# Patient Record
Sex: Female | Born: 1970 | Race: White | Hispanic: No | Smoking: Former smoker
Health system: Southern US, Community
[De-identification: ages and names within clinical notes are randomized; demographics above are authoritative.]

## PROBLEM LIST (undated history)

## (undated) ENCOUNTER — Inpatient Hospital Stay (HOSPITAL_COMMUNITY): Payer: Self-pay

## (undated) DIAGNOSIS — D649 Anemia, unspecified: Secondary | ICD-10-CM

## (undated) DIAGNOSIS — F419 Anxiety disorder, unspecified: Secondary | ICD-10-CM

## (undated) DIAGNOSIS — T7840XA Allergy, unspecified, initial encounter: Secondary | ICD-10-CM

## (undated) DIAGNOSIS — O26899 Other specified pregnancy related conditions, unspecified trimester: Secondary | ICD-10-CM

## (undated) DIAGNOSIS — O09519 Supervision of elderly primigravida, unspecified trimester: Secondary | ICD-10-CM

## (undated) DIAGNOSIS — J45909 Unspecified asthma, uncomplicated: Secondary | ICD-10-CM

## (undated) DIAGNOSIS — R1031 Right lower quadrant pain: Secondary | ICD-10-CM

## (undated) DIAGNOSIS — M1990 Unspecified osteoarthritis, unspecified site: Secondary | ICD-10-CM

## (undated) HISTORY — DX: Anemia, unspecified: D64.9

## (undated) HISTORY — DX: Unspecified asthma, uncomplicated: J45.909

## (undated) HISTORY — DX: Anxiety disorder, unspecified: F41.9

## (undated) HISTORY — DX: Unspecified osteoarthritis, unspecified site: M19.90

## (undated) HISTORY — DX: Supervision of elderly primigravida, unspecified trimester: O09.519

## (undated) HISTORY — PX: OTHER SURGICAL HISTORY: SHX169

## (undated) HISTORY — DX: Allergy, unspecified, initial encounter: T78.40XA

---

## 2011-09-06 ENCOUNTER — Ambulatory Visit: Payer: Self-pay | Admitting: Family Medicine

## 2011-12-29 ENCOUNTER — Ambulatory Visit: Payer: Self-pay | Admitting: Family Medicine

## 2012-01-03 ENCOUNTER — Ambulatory Visit: Payer: Self-pay | Admitting: Family Medicine

## 2012-01-05 ENCOUNTER — Ambulatory Visit: Payer: Self-pay | Admitting: Family Medicine

## 2012-01-23 ENCOUNTER — Ambulatory Visit: Payer: Self-pay | Admitting: Physician Assistant

## 2012-01-25 ENCOUNTER — Ambulatory Visit: Payer: Self-pay | Admitting: Physician Assistant

## 2012-10-09 ENCOUNTER — Ambulatory Visit: Payer: BC Managed Care – PPO

## 2012-10-09 ENCOUNTER — Encounter: Payer: Self-pay | Admitting: Family Medicine

## 2012-10-09 ENCOUNTER — Ambulatory Visit (INDEPENDENT_AMBULATORY_CARE_PROVIDER_SITE_OTHER): Payer: BC Managed Care – PPO | Admitting: Family Medicine

## 2012-10-09 VITALS — BP 119/64 | HR 72 | Temp 98.0°F | Resp 16 | Ht 68.0 in | Wt 183.0 lb

## 2012-10-09 DIAGNOSIS — M549 Dorsalgia, unspecified: Secondary | ICD-10-CM

## 2012-10-09 DIAGNOSIS — G8929 Other chronic pain: Secondary | ICD-10-CM

## 2012-10-09 DIAGNOSIS — M542 Cervicalgia: Secondary | ICD-10-CM | POA: Insufficient documentation

## 2012-10-09 DIAGNOSIS — E663 Overweight: Secondary | ICD-10-CM | POA: Insufficient documentation

## 2012-10-09 MED ORDER — CYCLOBENZAPRINE HCL 10 MG PO TABS: ORAL_TABLET | ORAL | Status: DC

## 2012-10-09 NOTE — Patient Instructions (Addendum)
Degenerative Disk Disease Degenerative disk disease is a condition caused by the changes that occur in the cushions of the backbone (spinal disks) as you grow older. Spinal disks are soft and compressible disks located between the bones of the spine (vertebrae). They act like shock absorbers. Degenerative disk disease can affect the whole spine. However, the neck and lower back are most commonly affected. Many changes can occur in the spinal disks with aging, such as:  The spinal disks may dry and shrink.  Small tears may occur in the tough, outer covering of the disk (annulus).  The disk space may become smaller due to loss of water.  Abnormal growths in the bone (spurs) may occur. This can put pressure on the nerve roots exiting the spinal canal, causing pain.  The spinal canal may become narrowed. CAUSES  Degenerative disk disease is a condition caused by the changes that occur in the spinal disks with aging. The exact cause is not known, but there is a genetic basis for many patients. Degenerative changes can occur due to loss of fluid in the disk. This makes the disk thinner and reduces the space between the backbones. Small cracks can develop in the outer layer of the disk. This can lead to the breakdown of the disk. You are more likely to get degenerative disk disease if you are overweight. Smoking cigarettes and doing heavy work such as weightlifting can also increase your risk of this condition. Degenerative changes can start after a sudden injury. Growth of bone spurs can compress the nerve roots and cause pain.  SYMPTOMS  The symptoms vary from person to person. Some people may have no pain, while others have severe pain. The pain may be so severe that it can limit your activities. The location of the pain depends on the part of your backbone that is affected. You will have neck or arm pain if a disk in the neck area is affected. You will have pain in your back, buttocks, or legs if a disk  in the lower back is affected. The pain becomes worse while bending, reaching up, or with twisting movements. The pain may start gradually and then get worse as time passes. It may also start after a major or minor injury. You may feel numbness or tingling in the arms or legs.  DIAGNOSIS  Your caregiver will ask you about your symptoms and about activities or habits that may cause the pain. He or she may also ask about any injuries, diseases, or treatments you have had earlier. Your caregiver will examine you to check for the range of movement that is possible in the affected area, to check for strength in your extremities, and to check for sensation in the areas of the arms and legs supplied by different nerve roots. An X-ray of the spine may be taken. Your caregiver may suggest other imaging tests, such as magnetic resonance imaging (MRI), if needed.  TREATMENT  Treatment includes rest, modifying your activities, and applying ice and heat. Your caregiver may prescribe medicines to reduce your pain and may ask you to do some exercises to strengthen your back. In some cases, you may need surgery. You and your caregiver will decide on the treatment that is best for you. HOME CARE INSTRUCTIONS   Follow proper lifting and walking techniques as advised by your caregiver.  Maintain good posture.  Exercise regularly as advised.  Perform relaxation exercises.  Change your sitting, standing, and sleeping habits as advised. Change positions   frequently.  Lose weight as advised.  Stop smoking if you smoke.  Wear supportive footwear. SEEK MEDICAL CARE IF:  Your pain does not go away within 1 to 4 weeks. SEEK IMMEDIATE MEDICAL CARE IF:   Your pain is severe.  You notice weakness in your arms, hands, or legs.  You begin to lose control of your bladder or bowel movements. MAKE SURE YOU:   Understand these instructions.  Will watch your condition.  Will get help right away if you are not doing  well or get worse. Document Released: 07/16/2007 Document Revised: 12/11/2011 Document Reviewed: 07/16/2007 [facility] Patient Information 2013 [place], [place].   For pain- take Aleve 1-2 tablets twice daily with food. Use heat therapy patches or moist heat for pain relief. Continue with Yoga. Limit lifting to no more than 20-25 pounds.

## 2012-10-09 NOTE — Progress Notes (Signed)
S: This 42 y.o. 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 She was an athlete in as a youth. She tried a yoga class recently and felt better for about 12 hours. Pt reports difficulty w/ sleep as well as AM stiffness and crepitus across shoulders and in neck. She denies weakness or numbness in upper extremities.  ROS: As per HPI.  O: Filed Vitals:   10/09/12 0907  BP: 119/64  Pulse: 72  Temp: 98 F (36.7 C)  Resp: 16   GEN: In NAD; WN,WD. HENT: Ad[place]/AT; EOMI w/ clear conj/sclerae. EACs normal. Oroph clear and moist. NECK: Good ROM w/ tender posterior muscles. No LAN or TMG. Head held in forward flexion. COR: RRR. LUNGS: Normal resp rate and effort. BACK: Mild kyphosis of T-spine and paravertebral muscle spasms. NEURO: A&O x 3; CNs intact. Motor- UEs 4/5 w/ good grip. DTRs- 1+/= in UEs. Nonfocal.   [facility] reading (PRIMARY) by  Dr. Audria Nine: C- spine- no significant abnormality.                                                                             T- spine- Mild scoliosis vs. muscle spams and early degenerative changes in mid-spine.  A/P:  1. Chronic back pain   Ambulatory referral to Physical Therapy [facility]  2-3 times/ week   2. Neck pain  Continue OTC NSAIDs, topical analgesic (try adhesive patch); work restrictions to include no weight lifting above 20-25 lbs.

## 2013-11-07 DIAGNOSIS — Z0271 Encounter for disability determination: Secondary | ICD-10-CM

## 2014-07-15 LAB — OB RESULTS CONSOLE RUBELLA ANTIBODY, IGM
Rubella: IMMUNE
Rubella: UNDETERMINED

## 2014-07-15 LAB — OB RESULTS CONSOLE GC/CHLAMYDIA
Chlamydia: NEGATIVE
Gonorrhea: NEGATIVE

## 2014-07-15 LAB — OB RESULTS CONSOLE ABO/RH: RH Type: POSITIVE

## 2014-07-15 LAB — OB RESULTS CONSOLE HEPATITIS B SURFACE ANTIGEN: Hepatitis B Surface Ag: NEGATIVE

## 2014-07-15 LAB — OB RESULTS CONSOLE PLATELET COUNT: Platelets: 243 10*3/uL

## 2014-07-15 LAB — OB RESULTS CONSOLE HIV ANTIBODY (ROUTINE TESTING): HIV: NONREACTIVE

## 2014-07-15 LAB — OB RESULTS CONSOLE HGB/HCT, BLOOD
HCT: 34 %
HEMOGLOBIN: 12 g/dL

## 2014-07-15 LAB — OB RESULTS CONSOLE ANTIBODY SCREEN: ANTIBODY SCREEN: NEGATIVE

## 2014-10-02 NOTE — L&D Delivery Note (Signed)
Operative Delivery Note At 6:42 AM a viable and healthy female was delivered via Vaginal, Vacuum Investment banker, operational(Extractor).  Presentation: vertex; Position: Left,, Occiput,, Anterior; Station: +2.  Verbal consent: obtained from patient.  Risks and benefits discussed in detail.  Risks include, but are not limited to the risks of anesthesia, bleeding, infection, damage to maternal tissues, fetal cephalhematoma.  There is also the risk of inability to effect vaginal delivery of the head, or shoulder dystocia that cannot be resolved by established maneuvers, leading to the need for emergency cesarean section. Indication: fetal tachycardia with intermittent deep variable decels APGAR: 4, 9; weight pending  .   Placenta status: Intact, Pathology Spontaneous.   Cord: 2 vessels with the following complications: None.  Cord pH: 7.17  Anesthesia: Epidural  Instruments: mushroom vacuum Episiotomy: None Lacerations: Periurethral;2nd degree Perineal Suture Repair: 3.0 chromic Est. Blood Loss (mL): 350  Mom to postpartum.  Baby to Couplet care / Skin to Skin.  Tammy Horne A 01/27/2015, 7:47 AM

## 2014-11-03 LAB — OB RESULTS CONSOLE RPR: RPR: NONREACTIVE

## 2014-12-07 ENCOUNTER — Encounter (HOSPITAL_COMMUNITY): Payer: Self-pay | Admitting: *Deleted

## 2014-12-07 ENCOUNTER — Inpatient Hospital Stay (HOSPITAL_COMMUNITY)
Admission: AD | Admit: 2014-12-07 | Discharge: 2014-12-07 | Disposition: A | Discharge status: Home / Self Care | Payer: 59 | Source: Ambulatory Visit | Attending: Obstetrics and Gynecology | Admitting: Obstetrics and Gynecology

## 2014-12-07 DIAGNOSIS — O212 Late vomiting of pregnancy: Secondary | ICD-10-CM | POA: Insufficient documentation

## 2014-12-07 DIAGNOSIS — Z3A32 32 weeks gestation of pregnancy: Secondary | ICD-10-CM | POA: Insufficient documentation

## 2014-12-07 LAB — URIC ACID: URIC ACID, SERUM: 3.5 mg/dL (ref 2.4–7.0)

## 2014-12-07 LAB — COMPREHENSIVE METABOLIC PANEL
ALBUMIN: 3.2 g/dL — AB (ref 3.5–5.2)
ALK PHOS: 91 U/L (ref 39–117)
ALT: 27 U/L (ref 0–35)
AST: 20 U/L (ref 0–37)
Anion gap: 7 (ref 5–15)
BUN: 12 mg/dL (ref 6–23)
CO2: 20 mmol/L (ref 19–32)
CREATININE: 0.49 mg/dL — AB (ref 0.50–1.10)
Calcium: 9.2 mg/dL (ref 8.4–10.5)
Chloride: 110 mmol/L (ref 96–112)
GFR calc Af Amer: >90 mL/min (ref >90)
GFR calc non Af Amer: >90 mL/min (ref >90)
Glucose, Bld: 91 mg/dL (ref 70–99)
POTASSIUM: 4.4 mmol/L (ref 3.5–5.1)
Sodium: 137 mmol/L (ref 135–145)
Total Bilirubin: 0.4 mg/dL (ref 0.3–1.2)
Total Protein: 6.6 g/dL (ref 6.0–8.3)

## 2014-12-07 LAB — CBC
HEMATOCRIT: 38.2 % (ref 36.0–46.0)
Hemoglobin: 13.2 g/dL (ref 12.0–15.0)
MCH: 31.9 pg (ref 26.0–34.0)
MCHC: 34.6 g/dL (ref 30.0–36.0)
MCV: 92.3 fL (ref 78.0–100.0)
PLATELETS: 242 10*3/uL (ref 150–400)
RBC: 4.14 MIL/uL (ref 3.87–5.11)
RDW: 13.6 % (ref 11.5–15.5)
WBC: 11.7 10*3/uL — AB (ref 4.0–10.5)

## 2014-12-07 MED ORDER — LACTATED RINGERS IV BOLUS (SEPSIS)
1000.0000 mL | Freq: Once | INTRAVENOUS | Status: AC
  Administered 2014-12-07: 1000 mL via INTRAVENOUS

## 2014-12-07 MED ORDER — ONDANSETRON 8 MG/NS 50 ML IVPB
8.0000 mg | Freq: Once | INTRAVENOUS | Status: AC
  Administered 2014-12-07: 8 mg via INTRAVENOUS
  Filled 2014-12-07: qty 8

## 2014-12-07 MED ORDER — M.V.I. ADULT IV INJ
Freq: Once | INTRAVENOUS | Status: AC
  Administered 2014-12-07: 17:00:00 via INTRAVENOUS
  Filled 2014-12-07: qty 10

## 2014-12-07 NOTE — MAU Provider Note (Signed)
History     CSN: [id number]  Arrival date and time: 12/07/14 1434 Provider notified of pt arrival  Provider at bedside   Chief Complaint  Patient presents with  . Nausea   HPI Comments: G3P0020 .3 wks sent from office for N/V. Reports not feeling well over the weekend, had a lot of stress from work and several panic attacks. One episode of N/V this am after eating breakfast. Second episode while traveling to work. Reports feeling like she was near passing out while driving, therefore she pulled over, vomited, and lied down until she felt better. She had one more episode of N/V after returning home. She tolerated lunch w/o problem and no N/V since. Good FM. No VB, cramping, or ctx. No fever. No diarrhea. No one in household sick. Had takeout dinner last night. Pregnancy complicated by AMA>40, 2VC, and anxiety.   OB History    Gravida Para Term Preterm AB TAB SAB Ectopic Multiple Living   0      Past Medical History  Diagnosis Date  . Allergy   . Anemia   . Anxiety   . Arthritis   . Asthma     History reviewed. No pertinent past surgical history.  History reviewed. No pertinent family history.  History  Substance Use Topics  . Smoking status: Never Smoker   . Smokeless tobacco: Not on file  . Alcohol Use: No    Allergies: No Known Allergies  Prescriptions prior to admission  Medication Sig Dispense Refill Last Dose  . ALPRAZolam (XANAX) 0.25 MG tablet Take 0.25 mg by mouth at bedtime as needed.   Taking  . cyclobenzaprine (FLEXERIL) 10 MG tablet Take 1 tablet at bedtime or bid prn back pain and spasms. 60 tablet 1   . FLUoxetine (PROZAC) 40 MG capsule Take 60 mg by mouth daily.   Taking    Review of Systems  Constitutional: Positive for malaise/fatigue.  HENT: Negative.   Eyes: Negative.   Respiratory: Negative.   Cardiovascular: Negative.   Gastrointestinal: Negative.   Genitourinary: Negative.   Musculoskeletal: Negative.   Skin:  Negative.   Neurological: Negative.   Endo/Heme/Allergies: Negative.   Psychiatric/Behavioral: Negative.    Physical Exam   Blood pressure 106/74, pulse 89, temperature 98 F (36.7 C), temperature source Oral, resp. rate 16, height  (1.676 m), weight 84.369 kg (186 lb).  Physical Exam  Constitutional: She is oriented to person, place, and time. She appears well-developed and well-nourished.  HENT:  Head: Normocephalic and atraumatic.  Neck: Normal range of motion. Neck supple.  Cardiovascular: Normal rate and regular rhythm.   Respiratory: Effort normal and breath sounds normal.  GI: Soft. Bowel sounds are normal.  gravid  Genitourinary:  deferred  Musculoskeletal: Normal range of motion.  Neurological: She is alert and oriented to person, place, and time.  Skin: Skin is warm and dry.  Psychiatric: She has a normal mood and affect.  EFM: 140's, + accels, no decels, 10 beat variability Toco: no contractions  CBC    Component Value Date/Time   WBC 11.7* 12/07/2014 1500   RBC 4.14 12/07/2014 1500   HGB 13.2 12/07/2014 1500   HGB 12.0 07/15/2014   HCT 38.2 12/07/2014 1500   HCT 34 07/15/2014   PLT 242 12/07/2014 1500   PLT 243 07/15/2014   MCV 92.3 12/07/2014 1500   MCH 31.9 12/07/2014 1500   MCHC 34.6 12/07/2014 1500   RDW 13.6 12/07/2014  1500   CMP     Component Value Date/Time   NA 137 12/07/2014 1500   K 4.4 12/07/2014 1500   CL 110 12/07/2014 1500   CO2 20 12/07/2014 1500   GLUCOSE 91 12/07/2014 1500   BUN 12 12/07/2014 1500   CREATININE 0.49* 12/07/2014 1500   CALCIUM 9.2 12/07/2014 1500   PROT 6.6 12/07/2014 1500   ALBUMIN 3.2* 12/07/2014 1500   AST 20 12/07/2014 1500   ALT 27 12/07/2014 1500   ALKPHOS 91 12/07/2014 1500   BILITOT 0.4 12/07/2014 1500   GFRNONAA >90 12/07/2014 1500   GFRAA >90 12/07/2014 1500      MAU Course  Procedures IVF bolus IV Zofran IV MTV CBC, CMP, uric acid NST   Assessment and Plan  32.[redacted] weeks  gestation Nausea and vomiting. IMproved with meds and IV hydration. Pt tolerating oral intake. Finish 2nd bag of IVF then d/c home No evidence PEC, nl bp, nl labs  BHAMBRI, MELANIE, N   Zeshan Sena A. 12/07/2014 4:26 PM   12/07/2014, 3:22 PM

## 2014-12-07 NOTE — MAU Note (Signed)
Cramping, uncomfortable past 3 days. Today has had N/V, seeing stars, felt like she was going to pass out. Kept cereal down at 1200. States blood sugar elevated in office today and BP was low. Sent to MAU for further assessment and treatment.

## 2014-12-07 NOTE — Progress Notes (Signed)
Patient tolerating ice water and ice chips. Med given for nausea.

## 2014-12-07 NOTE — Progress Notes (Signed)
Tolerating water and crackers. Nausea subsided.

## 2014-12-07 NOTE — Discharge Instructions (Signed)
Keep your scheduled appointment for prenatal care. Call the office or provider on call with further concerns. °

## 2014-12-31 LAB — OB RESULTS CONSOLE GBS: STREP GROUP B AG: NEGATIVE

## 2015-01-12 ENCOUNTER — Inpatient Hospital Stay (HOSPITAL_COMMUNITY)
Admission: EM | Admit: 2015-01-12 | Discharge: 2015-01-12 | Disposition: A | Discharge status: Home / Self Care | Payer: 59 | Source: Ambulatory Visit | Attending: Obstetrics & Gynecology | Admitting: Obstetrics & Gynecology

## 2015-01-12 ENCOUNTER — Encounter (HOSPITAL_COMMUNITY): Payer: Self-pay | Admitting: *Deleted

## 2015-01-12 DIAGNOSIS — Z87891 Personal history of nicotine dependence: Secondary | ICD-10-CM | POA: Diagnosis not present

## 2015-01-12 DIAGNOSIS — O26899 Other specified pregnancy related conditions, unspecified trimester: Secondary | ICD-10-CM

## 2015-01-12 DIAGNOSIS — Z3A37 37 weeks gestation of pregnancy: Secondary | ICD-10-CM | POA: Diagnosis not present

## 2015-01-12 DIAGNOSIS — O09523 Supervision of elderly multigravida, third trimester: Secondary | ICD-10-CM | POA: Diagnosis not present

## 2015-01-12 DIAGNOSIS — O9989 Other specified diseases and conditions complicating pregnancy, childbirth and the puerperium: Secondary | ICD-10-CM | POA: Diagnosis not present

## 2015-01-12 DIAGNOSIS — R1031 Right lower quadrant pain: Secondary | ICD-10-CM | POA: Insufficient documentation

## 2015-01-12 HISTORY — DX: Right lower quadrant pain: R10.31

## 2015-01-12 HISTORY — DX: Other specified pregnancy related conditions, unspecified trimester: O26.899

## 2015-01-12 MED ORDER — PROMETHAZINE HCL 12.5 MG PO TABS: 12.5000 mg | ORAL_TABLET | Freq: Four times a day (QID) | ORAL | Status: DC | PRN

## 2015-01-12 NOTE — MAU Provider Note (Signed)
History     CSN: [id number]  Arrival date and time: 01/12/15 1125 Provider on unit: 1145 (waiting for pt to arrive, however, pt was registered under wrong MD) Provider at bedside: 1212      Chief Complaint  Patient presents with  . Abdominal Pain  . Emesis   HPI  Ms. Tammy MarchSarah Horne is a 44 yo G3P0020 at 37.[redacted] wks gestation sent from the office with complaints of sharp, stabbing RLQ pain since 0800. She was seen in the office today for an OB visit and NST.  While on her way to her appointment, she states that the pain increased to the point where she could barely stand up.  She started vomiting during her NST.  She reports eating shrimp at a restaurant last night, but felt fine until 0800 this morning.  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   Past Medical History  Diagnosis Date  . Allergy   . Anemia   . Anxiety   . Arthritis   . Asthma   . Pregnancy with abdominal pain of right lower quadrant, antepartum 01/12/2015    Past Surgical History  Procedure Laterality Date  . Lipo suction      Family History  Problem Relation Age of Onset  . Diabetes Mother     History  Substance Use Topics  . Smoking status: Former Games developermoker  . Smokeless tobacco: Not on file  . Alcohol Use: No    Allergies: No Known Allergies  Prescriptions prior to admission  Medication Sig Dispense Refill Last Dose  . diphenhydramine-acetaminophen (TYLENOL PM) 25-500 MG TABS Take 2 tablets by mouth at bedtime as needed (sleep).    01/11/2015 at Unknown time  . cyclobenzaprine (FLEXERIL) 10 MG tablet Take 1 tablet at bedtime or bid prn back pain and spasms. (Patient not taking: Reported on 12/07/2014) 60 tablet 1 more than one month    Review of Systems  Constitutional: Negative.   HENT: Negative.   Eyes: Negative.   Respiratory: Negative.   Cardiovascular: Negative.   Gastrointestinal: Positive for vomiting and abdominal pain.       RLQ pain that started at 8 am,  continued to increase while having NST in office, vomiting started at office; all sx's have resolved now "feels 110% better"  Genitourinary: Negative.        (+) FM; no UC's; VE at office 1cm with some bloody show  Musculoskeletal: Negative.   Skin: Negative.   Neurological: Negative.   Endo/Heme/Allergies: Negative.   Psychiatric/Behavioral: Negative.    CEFM FHR: bpm / moderate variability / accels present / no decels TOCO: irregular UC's   Physical Exam   Blood pressure 118/72, pulse 66, temperature 97.8 F (36.6 C), resp. rate 20, height 5\' 7"  (1.702 m), weight 85.276 kg (188 lb).  Physical Exam  Constitutional: She is oriented to person, place, and time. She appears well-developed and well-nourished.  HENT:  Head: Normocephalic and atraumatic.  Eyes: Pupils are equal, round, and reactive to light.  Neck: Normal range of motion.  Cardiovascular: Normal rate, regular rhythm, normal heart sounds and intact distal pulses.   Respiratory: Effort normal and breath sounds normal.  GI: Soft. Bowel sounds are normal.  Genitourinary:  Gravid, S=D, Pelvic deferred - done in the office  Musculoskeletal: Normal range of motion.  Neurological: She is alert and oriented to person, place, and time. She has normal reflexes.  Skin: Skin is warm and dry.  Psychiatric: She has  a normal mood and affect. Her behavior is normal. Judgment and thought content normal.    MAU Course  Procedures CEFM Assessment and Plan  44 yo G3P0020 at 37.[redacted] wks gestation Abdominal Pain with unknown etiology 2VC Category 1 FHR tracing  Discharge home Labor precautions / B.R.A.T. Diet instructions reviewed Rx for Phenergan 12.5 mg po every 4-6 hours prn Call the office, if you have any further problems, questions or concerns Schedule ROB and NST for next week ASAP  *Dr. Seymour Bars notified of assessment and plan - agrees  Kenard Gower MSN, CNM 01/12/2015, 12:22 PM

## 2015-01-12 NOTE — Progress Notes (Signed)
Written and verbal d/c instructions given and understanding voiced. 

## 2015-01-12 NOTE — MAU Note (Signed)
When awoke this am and walked around alittle started having sharp, stabbing pain RLQ. Pain so bad pt vomited. Denies diarrhea. Had appt at office and sent over to MAU from there. . 1cm in office. Did see blood in urine when obtained urine sample in [place].

## 2015-01-12 NOTE — Discharge Instructions (Signed)
Abdominal Pain During Pregnancy Abdominal pain is common in pregnancy. Most of the time, it does not cause harm. There are many causes of abdominal pain. Some causes are more serious than others. Some of the causes of abdominal pain in pregnancy are easily diagnosed. Occasionally, the diagnosis takes time to understand. Other times, the cause is not determined. Abdominal pain can be a sign that something is very wrong with the pregnancy, or the pain may have nothing to do with the pregnancy at all. For this reason, always tell your health care provider if you have any abdominal discomfort. HOME CARE INSTRUCTIONS  Monitor your abdominal pain for any changes. The following actions may help to alleviate any discomfort you are experiencing:  Do not have sexual intercourse or put anything in your vagina until your symptoms go away completely.  Get plenty of rest until your pain improves.  Drink clear fluids if you feel nauseous. Avoid solid food as long as you are uncomfortable or nauseous.  Only take over-the-counter or prescription medicine as directed by your health care provider.  Keep all follow-up appointments with your health care provider. SEEK IMMEDIATE MEDICAL CARE IF:  You are bleeding, leaking fluid, or passing tissue from the vagina.  You have increasing pain or cramping.  You have persistent vomiting.  You have painful or bloody urination.  You have a fever.  You notice a decrease in your baby's movements.  You have extreme weakness or feel faint.  You have shortness of breath, with or without abdominal pain.  You develop a severe headache with abdominal pain.  You have abnormal vaginal discharge with abdominal pain.  You have persistent diarrhea.  You have abdominal pain that continues even after rest, or gets worse. MAKE SURE YOU:   Understand these instructions.  Will watch your condition.  Will get help right away if you are not doing well or get  worse. Document Released: 09/18/2005 Document Revised: 07/09/2013 Document Reviewed: 04/17/2013 [facility]ExitCare Patient Information 2015 [place], [place]. This information is not intended to replace advice given to you by your health care provider. Make sure you discuss any questions you have with your health care provider. Braxton Hicks Contractions Contractions of the uterus can occur throughout pregnancy. Contractions are not always a sign that you are in labor.  WHAT ARE BRAXTON HICKS CONTRACTIONS?  Contractions that occur before labor are called Braxton Hicks contractions, or false labor. Toward the end of pregnancy (32-34 weeks), these contractions can develop more often and may become more forceful. This is not true labor because these contractions do not result in opening (dilatation) and thinning of the cervix. They are sometimes difficult to tell apart from true labor because these contractions can be forceful and people have different pain tolerances. You should not feel embarrassed if you go to the hospital with false labor. Sometimes, the only way to tell if you are in true labor is for your health care provider to look for changes in the cervix. If there are no prenatal problems or other health problems associated with the pregnancy, it is completely safe to be sent home with false labor and await the onset of true labor. HOW CAN YOU TELL THE DIFFERENCE BETWEEN TRUE AND FALSE LABOR? False Labor  The contractions of false labor are usually shorter and not as hard as those of true labor.   The contractions are usually irregular.   The contractions are often felt in the front of the lower abdomen and in the groin.  The contractions may go away when you walk around or change positions while lying down.   The contractions get weaker and are shorter lasting as time goes on.   The contractions do not usually become progressively stronger, regular, and closer together as with true labor.   True Labor  Contractions in true labor last 30-70 seconds, become very regular, usually become more intense, and increase in frequency.   The contractions do not go away with walking.   The discomfort is usually felt in the top of the uterus and spreads to the lower abdomen and low back.   True labor can be determined by your health care provider with an exam. This will show that the cervix is dilating and getting thinner.  WHAT TO REMEMBER  Keep up with your usual exercises and follow other instructions given by your health care provider.   Take medicines as directed by your health care provider.   Keep your regular prenatal appointments.   Eat and drink lightly if you think you are going into labor.   If Braxton Hicks contractions are making you uncomfortable:   Change your position from lying down or resting to walking, or from walking to resting.   Sit and rest in a tub of warm water.   Drink 2-3 glasses of water. Dehydration may cause these contractions.   Do slow and deep breathing several times an hour.  WHEN SHOULD I SEEK IMMEDIATE MEDICAL CARE? Seek immediate medical care if:  Your contractions become stronger, more regular, and closer together.   You have fluid leaking or gushing from your vagina.   You have a fever.   You pass blood-tinged mucus.   You have vaginal bleeding.   You have continuous abdominal pain.   You have low back pain that you never had before.   You feel your baby's head pushing down and causing pelvic pressure.   Your baby is not moving as much as it used to.  Document Released: 09/18/2005 Document Revised: 09/23/2013 Document Reviewed: 06/30/2013 [facility] Patient Information 2015 [place], [place]. This information is not intended to replace advice given to you by your health care provider. Make sure you discuss any questions you have with your health care provider. Food Choices to Help Relieve Vomiting When  you have vomiting, the foods you eat and your eating habits are very important. Choosing the right foods and drinks can help relieve voimiting. Also, because diarrhea can last up to 7 days, you need to replace lost fluids and electrolytes (such as sodium, potassium, and chloride) in order to help prevent dehydration.  WHAT GENERAL GUIDELINES DO I NEED TO FOLLOW?  Slowly drink 1 cup (8 oz) of fluid for each episode of diarrhea. If you are getting enough fluid, your urine will be clear or pale yellow.  Eat starchy foods. Some good choices include white rice, white toast, pasta, low-fiber cereal, baked potatoes (without the skin), saltine crackers, and bagels.  Avoid large servings of any cooked vegetables.  Limit fruit to two servings per day. A serving is  cup or 1 small piece.  Choose foods with less than 2 g of fiber per serving.  Limit fats to less than 8 tsp (38 g) per day.  Avoid fried foods.  Eat foods that have probiotics in them. Probiotics can be found in certain dairy products.  Avoid foods and beverages that may increase the speed at which food moves through the stomach and intestines (gastrointestinal tract). Things to avoid include:  High-fiber foods, such as dried fruit, raw fruits and vegetables, nuts, seeds, and whole grain foods.  Spicy foods and high-fat foods.  Foods and beverages sweetened with high-fructose corn syrup, honey, or sugar alcohols such as xylitol, sorbitol, and mannitol. WHAT FOODS ARE RECOMMENDED? Grains White rice. White, [place], or pita breads (fresh or toasted), including plain rolls, buns, or bagels. White pasta. Saltine, soda, or graham crackers. Pretzels. Low-fiber cereal. Cooked cereals made with water (such as cornmeal, farina, or cream cereals). Plain muffins. Matzo. Melba toast. Zwieback.  Vegetables Potatoes (without the skin). Strained tomato and vegetable juices. Most well-cooked and canned vegetables without seeds. Tender  lettuce. Fruits Cooked or canned applesauce, apricots, cherries, fruit cocktail, grapefruit, peaches, pears, or plums. Fresh bananas, apples without skin, cherries, grapes, cantaloupe, grapefruit, peaches, oranges, or plums.  Meat and Other Protein Products Baked or boiled chicken. Eggs. Tofu. Fish. Seafood. Smooth peanut butter. Ground or well-cooked tender beef, ham, veal, lamb, pork, or poultry.  Dairy Plain yogurt, kefir, and unsweetened liquid yogurt. Lactose-free milk, buttermilk, or soy milk. Plain hard cheese. Beverages Sport drinks. Clear broths. Diluted fruit juices (except prune). Regular, caffeine-free sodas such as ginger ale. Water. Decaffeinated teas. Oral rehydration solutions. Sugar-free beverages not sweetened with sugar alcohols. Other Bouillon, broth, or soups made from recommended foods.  The items listed above may not be a complete list of recommended foods or beverages. Contact your dietitian for more options. WHAT FOODS ARE NOT RECOMMENDED? Grains Whole grain, whole wheat, bran, or rye breads, rolls, pastas, crackers, and cereals. Wild or brown rice. Cereals that contain more than 2 g of fiber per serving. Corn tortillas or taco shells. Cooked or dry oatmeal. Granola. Popcorn. Vegetables Raw vegetables. Cabbage, broccoli, Brussels sprouts, artichokes, baked beans, beet greens, corn, kale, legumes, peas, sweet potatoes, and yams. Potato skins. Cooked spinach and cabbage. Fruits Dried fruit, including raisins and dates. Raw fruits. Stewed or dried prunes. Fresh apples with skin, apricots, mangoes, pears, raspberries, and strawberries.  Meat and Other Protein Products Chunky peanut butter. Nuts and seeds. Beans and lentils. Tomasa Blase.  Dairy High-fat cheeses. Milk, chocolate milk, and beverages made with milk, such as milk shakes. Cream. Ice cream. Sweets and Desserts Sweet rolls, doughnuts, and sweet breads. Pancakes and waffles. Fats and Oils Butter. Cream sauces.  Margarine. Salad oils. Plain salad dressings. Olives. Avocados.  Beverages Caffeinated beverages (such as coffee, tea, soda, or energy drinks). Alcoholic beverages. Fruit juices with pulp. Prune juice. Soft drinks sweetened with high-fructose corn syrup or sugar alcohols. Other Coconut. Hot sauce. Chili powder. Mayonnaise. Gravy. Cream-based or milk-based soups.  The items listed above may not be a complete list of foods and beverages to avoid. Contact your dietitian for more information. WHAT SHOULD I DO IF I BECOME DEHYDRATED? Diarrhea can sometimes lead to dehydration. Signs of dehydration include dark urine and dry mouth and skin. If you think you are dehydrated, you should rehydrate with an oral rehydration solution. These solutions can be purchased at pharmacies, retail stores, or online.  Drink -1 cup (120-240 mL) of oral rehydration solution each time you have an episode of diarrhea. If drinking this amount makes your diarrhea worse, try drinking smaller amounts more often. For example, drink 1-3 tsp (5-15 mL) every 5-10 minutes.  A general rule for staying hydrated is to drink 1-2 L of fluid per day. Talk to your health care provider about the specific amount you should be drinking each day. Drink enough fluids to keep your urine clear or pale  yellow.  Document Released: 12/09/2003 Document Revised: 09/23/2013 Document Reviewed: 08/11/2013 [facility] Patient Information 2015 [place], [place]. This information is not intended to replace advice given to you by your health care provider. Make sure you discuss any questions you have with your health care provider.

## 2015-01-20 ENCOUNTER — Other Ambulatory Visit: Payer: Self-pay | Admitting: Obstetrics and Gynecology

## 2015-01-21 ENCOUNTER — Encounter (HOSPITAL_COMMUNITY): Payer: Self-pay | Admitting: *Deleted

## 2015-01-21 ENCOUNTER — Telehealth (HOSPITAL_COMMUNITY): Payer: Self-pay | Admitting: *Deleted

## 2015-01-21 NOTE — Telephone Encounter (Signed)
Preadmission screen  

## 2015-01-26 ENCOUNTER — Inpatient Hospital Stay (HOSPITAL_COMMUNITY)
Admission: AD | Admit: 2015-01-26 | Discharge: 2015-01-29 | DRG: 775 | Disposition: A | Discharge status: Home / Self Care | Payer: 59 | Source: Ambulatory Visit | Attending: Obstetrics and Gynecology | Admitting: Obstetrics and Gynecology

## 2015-01-26 ENCOUNTER — Inpatient Hospital Stay (HOSPITAL_COMMUNITY): Payer: 59 | Admitting: Anesthesiology

## 2015-01-26 ENCOUNTER — Inpatient Hospital Stay (HOSPITAL_COMMUNITY): Payer: 59

## 2015-01-26 DIAGNOSIS — O09523 Supervision of elderly multigravida, third trimester: Secondary | ICD-10-CM

## 2015-01-26 DIAGNOSIS — Z3A38 38 weeks gestation of pregnancy: Secondary | ICD-10-CM | POA: Diagnosis present

## 2015-01-26 DIAGNOSIS — O4100X0 Oligohydramnios, unspecified trimester, not applicable or unspecified: Secondary | ICD-10-CM | POA: Diagnosis present

## 2015-01-26 DIAGNOSIS — O9902 Anemia complicating childbirth: Secondary | ICD-10-CM | POA: Diagnosis present

## 2015-01-26 DIAGNOSIS — O288 Other abnormal findings on antenatal screening of mother: Secondary | ICD-10-CM

## 2015-01-26 DIAGNOSIS — Z3483 Encounter for supervision of other normal pregnancy, third trimester: Secondary | ICD-10-CM | POA: Diagnosis present

## 2015-01-26 DIAGNOSIS — O4103X0 Oligohydramnios, third trimester, not applicable or unspecified: Principal | ICD-10-CM | POA: Diagnosis present

## 2015-01-26 LAB — CBC
HEMATOCRIT: 38.7 % (ref 36.0–46.0)
HEMOGLOBIN: 13.7 g/dL (ref 12.0–15.0)
MCH: 32.7 pg (ref 26.0–34.0)
MCHC: 35.4 g/dL (ref 30.0–36.0)
MCV: 92.4 fL (ref 78.0–100.0)
Platelets: 205 10*3/uL (ref 150–400)
RBC: 4.19 MIL/uL (ref 3.87–5.11)
RDW: 14 % (ref 11.5–15.5)
WBC: 14.5 10*3/uL — ABNORMAL HIGH (ref 4.0–10.5)

## 2015-01-26 LAB — TYPE AND SCREEN
ABO/RH(D): A POS
ANTIBODY SCREEN: NEGATIVE

## 2015-01-26 MED ORDER — FLEET ENEMA 7-19 GM/118ML RE ENEM: 1.0000 | ENEMA | RECTAL | Status: DC | PRN

## 2015-01-26 MED ORDER — DIPHENHYDRAMINE HCL 50 MG/ML IJ SOLN: 12.5000 mg | INTRAMUSCULAR | Status: DC | PRN

## 2015-01-26 MED ORDER — OXYTOCIN 40 UNITS IN LACTATED RINGERS INFUSION - SIMPLE MED: 62.5000 mL/h | INTRAVENOUS | Status: DC

## 2015-01-26 MED ORDER — BUTORPHANOL TARTRATE 1 MG/ML IJ SOLN: 1.0000 mg | Freq: Once | INTRAMUSCULAR | Status: DC

## 2015-01-26 MED ORDER — FENTANYL 2.5 MCG/ML BUPIVACAINE 1/10 % EPIDURAL INFUSION (WH - ANES)
14.0000 mL/h | INTRAMUSCULAR | Status: DC | PRN
  Administered 2015-01-26: 14 mL/h via EPIDURAL
  Filled 2015-01-26: qty 125

## 2015-01-26 MED ORDER — LACTATED RINGERS IV SOLN: 500.0000 mL | INTRAVENOUS | Status: DC | PRN

## 2015-01-26 MED ORDER — LIDOCAINE HCL (PF) 1 % IJ SOLN
INTRAMUSCULAR | Status: DC | PRN
  Administered 2015-01-26: 10 mL

## 2015-01-26 MED ORDER — EPHEDRINE 5 MG/ML INJ
10.0000 mg | INTRAVENOUS | Status: AC | PRN
  Administered 2015-01-26 (×2): 10 mg via INTRAVENOUS

## 2015-01-26 MED ORDER — LACTATED RINGERS IV SOLN
INTRAVENOUS | Status: DC
  Administered 2015-01-26 (×3): via INTRAVENOUS

## 2015-01-26 MED ORDER — LIDOCAINE HCL (PF) 1 % IJ SOLN
30.0000 mL | INTRAMUSCULAR | Status: DC | PRN
  Filled 2015-01-26: qty 30

## 2015-01-26 MED ORDER — EPHEDRINE 5 MG/ML INJ
10.0000 mg | INTRAVENOUS | Status: DC | PRN
  Filled 2015-01-26: qty 4
  Filled 2015-01-26: qty 2

## 2015-01-26 MED ORDER — ACETAMINOPHEN 325 MG PO TABS: 650.0000 mg | ORAL_TABLET | ORAL | Status: DC | PRN

## 2015-01-26 MED ORDER — TERBUTALINE SULFATE 1 MG/ML IJ SOLN: 0.2500 mg | Freq: Once | INTRAMUSCULAR | Status: AC | PRN

## 2015-01-26 MED ORDER — PHENYLEPHRINE 40 MCG/ML (10ML) SYRINGE FOR IV PUSH (FOR BLOOD PRESSURE SUPPORT)
80.0000 ug | PREFILLED_SYRINGE | INTRAVENOUS | Status: DC | PRN
  Filled 2015-01-26: qty 20
  Filled 2015-01-26: qty 2

## 2015-01-26 MED ORDER — OXYTOCIN 40 UNITS IN LACTATED RINGERS INFUSION - SIMPLE MED
1.0000 m[IU]/min | INTRAVENOUS | Status: DC
Start: 2015-01-26 — End: 2015-01-27
  Administered 2015-01-27: 2 m[IU]/min via INTRAVENOUS
  Filled 2015-01-26: qty 1000

## 2015-01-26 MED ORDER — OXYTOCIN BOLUS FROM INFUSION
500.0000 mL | INTRAVENOUS | Status: DC
  Administered 2015-01-27: 500 mL via INTRAVENOUS

## 2015-01-26 MED ORDER — LACTATED RINGERS IV SOLN
500.0000 mL | Freq: Once | INTRAVENOUS | Status: AC
  Administered 2015-01-26: 500 mL via INTRAVENOUS

## 2015-01-26 MED ORDER — CITRIC ACID-SODIUM CITRATE 334-500 MG/5ML PO SOLN: 30.0000 mL | ORAL | Status: DC | PRN

## 2015-01-26 MED ORDER — OXYCODONE-ACETAMINOPHEN 5-325 MG PO TABS: 1.0000 | ORAL_TABLET | ORAL | Status: DC | PRN

## 2015-01-26 MED ORDER — PHENYLEPHRINE 40 MCG/ML (10ML) SYRINGE FOR IV PUSH (FOR BLOOD PRESSURE SUPPORT)
80.0000 ug | PREFILLED_SYRINGE | INTRAVENOUS | Status: DC | PRN
Start: 2015-01-26 — End: 2015-01-27
  Administered 2015-01-26 – 2015-01-27 (×2): 80 ug via INTRAVENOUS
  Filled 2015-01-26: qty 2

## 2015-01-26 MED ORDER — OXYCODONE-ACETAMINOPHEN 5-325 MG PO TABS: 2.0000 | ORAL_TABLET | ORAL | Status: DC | PRN

## 2015-01-26 MED ORDER — ONDANSETRON HCL 4 MG/2ML IJ SOLN
4.0000 mg | Freq: Four times a day (QID) | INTRAMUSCULAR | Status: DC | PRN
  Administered 2015-01-27: 4 mg via INTRAVENOUS
  Filled 2015-01-26: qty 2

## 2015-01-26 NOTE — H&P (Addendum)
Tammy PraderSarah A Horne is a 44 y.o. female presenting for labor @ 39 wk 5 days. Pt c/o ctx 2 hrs after office visit. NST there was NR. baseline 140 min variability. sono done: nl fluid, BPP 8/8, 7lb 15 oz. Here NR NST repeat sono showed low AFI BPP 6/8. Pt denies SROM. Pt admitted for labor augmentation  Maternal Medical History:  Reason for admission: Contractions.   Contractions: Onset was 3-5 hours ago.   Frequency: regular.   Perceived severity is moderate.    Fetal activity: Perceived fetal activity is normal.    Prenatal complications: no prenatal complications Prenatal Complications - Diabetes: none.    OB History    Gravida Para Term Preterm AB TAB SAB Ectopic Multiple Living   3    2  2    0     Past Medical History  Diagnosis Date  . Allergy   . Anemia   . Anxiety   . Arthritis   . Asthma   . Pregnancy with abdominal pain of right lower quadrant, antepartum 01/12/2015  . AMA (advanced maternal age) primigravida 6835+    Past Surgical History  Procedure Laterality Date  . Lipo suction     Family History: family history includes Cancer in her cousin; Diabetes in her mother; Hypertension in her mother. Social History:  reports that she has quit smoking. She does not have any smokeless tobacco history on file. She reports that she does not drink alcohol or use illicit drugs.   Prenatal Transfer Tool  Maternal Diabetes: No Genetic Screening: Normal Maternal Ultrasounds/Referrals: Abnormal:  Findings:   Isolated choroid plexus cyst, Other:2 vessel cord Fetal Ultrasounds or other Referrals:  Fetal echo nl Maternal Substance Abuse:  No Significant Maternal Medications:  Meds include: Other: valtrex Significant Maternal Lab Results:  Lab values include: Group B Strep negative Other Comments:  AMA>40  Review of Systems  All other systems reviewed and are negative.   Dilation: 3 Effacement (%): 60, 70 Station: -3 Exam by:: LCarpenter,RN Blood pressure 154/84, pulse 67,  temperature 98.3 F (36.8 C), temperature source Oral, resp. rate 20, height 5\' 7"  (1.702 m), weight 86.637 kg (191 lb), SpO2 98 %. Maternal Exam:  Uterine Assessment: Contraction strength is mild.  Contraction frequency is irregular.   Abdomen: Patient reports no abdominal tenderness. Estimated fetal weight is 7lb 15 oz.   Fetal presentation: vertex  Introitus: Normal vulva. Amniotic fluid character: not assessed.  Pelvis: adequate for delivery.   Cervix: Cervix evaluated by digital exam.     Physical Exam  Constitutional: She appears well-developed and well-nourished.  HENT:  Head: Atraumatic.  Eyes: EOM are normal.  Neck: Neck supple.  Cardiovascular: Regular rhythm.   Respiratory: Breath sounds normal.  GI: Soft.  Musculoskeletal: She exhibits edema.  Skin: Skin is warm and dry.  Psychiatric: She has a normal mood and affect.    Prenatal labs: ABO, Rh: A/Positive/-- (10/14 0000) Antibody: Negative (10/14 0000) Rubella: Immune, Equivocal (10/14 0000) RPR: Nonreactive (02/02 0000)  HBsAg: Negative (10/14 0000)  HIV: Non-reactive (10/14 0000)  GBS: Negative (03/31 0000)   Assessment/Plan: Latent phase BPP 6/8, oligohydramnios AMA>40 P) admit routine labs. Pitocin augmentation. Epidural. Amniotomy prn   [name] A 01/26/2015, 11:11 PM

## 2015-01-26 NOTE — Anesthesia Preprocedure Evaluation (Signed)
Anesthesia Evaluation  Patient identified by MRN, date of birth, ID band Patient awake and Patient confused    Reviewed: Allergy & Precautions, H&P , NPO status , Patient's Chart, lab work & pertinent test results  Airway Mallampati: II   Neck ROM: full    Dental  (+) Teeth Intact   Pulmonary asthma , former smoker,  breath sounds clear to auscultation  Pulmonary exam normal       Cardiovascular Exercise Tolerance: Good Rhythm:regular Rate:Normal     Neuro/Psych    GI/Hepatic   Endo/Other  Morbid obesity  Renal/GU      Musculoskeletal   Abdominal Normal abdominal exam  (+)   Peds  Hematology   Anesthesia Other Findings   Reproductive/Obstetrics (+) Pregnancy                             Anesthesia Physical Anesthesia Plan  ASA: III  Anesthesia Plan: Epidural   Post-op Pain Management:    Induction:   Airway Management Planned:   Additional Equipment:   Intra-op Plan:   Post-operative Plan:   Informed Consent: I have reviewed the patients History and Physical, chart, labs and discussed the procedure including the risks, benefits and alternatives for the proposed anesthesia with the patient or authorized representative who has indicated his/her understanding and acceptance.     Plan Discussed with:   Anesthesia Plan Comments:         Anesthesia Quick Evaluation

## 2015-01-26 NOTE — MAU Note (Signed)
Pt seen in MD office today, 60% effaced.  Scheduled for induction on Thursday.  Contractions started around 1430, having light bleeding, denies LOF.

## 2015-01-26 NOTE — Anesthesia Procedure Notes (Signed)
Epidural Patient location during procedure: OB Start time: 01/26/2015 11:03 PM End time: 01/26/2015 11:20 PM  Staffing Anesthesiologist: Sebastian AcheMANNY, Jacinda Kanady Performed by: anesthesiologist   Preanesthetic Checklist Completed: patient identified, site marked, surgical consent, pre-op evaluation, timeout performed, IV checked, risks and benefits discussed and monitors and equipment checked  Epidural Patient position: sitting Prep: site prepped and draped and DuraPrep Patient monitoring: heart rate, continuous pulse ox and blood pressure Approach: midline Location: L2-L3 Injection technique: LOR air  Needle:  Needle type: Tuohy  Needle gauge: 17 G Needle length: 9 cm and 9 Needle insertion depth: 6 cm Catheter type: closed end flexible Catheter size: 19 Gauge Catheter at skin depth: 13 cm Test dose: negative  Assessment Events: blood not aspirated, injection not painful, no injection resistance, negative IV test and no paresthesia  Additional Notes   Patient tolerated the insertion well without complications.Reason for block:procedure for pain

## 2015-01-27 ENCOUNTER — Encounter (HOSPITAL_COMMUNITY): Payer: Self-pay | Admitting: *Deleted

## 2015-01-27 LAB — RPR: RPR Ser Ql: NONREACTIVE

## 2015-01-27 LAB — ABO/RH: ABO/RH(D): A POS

## 2015-01-27 MED ORDER — LACTATED RINGERS IV SOLN
INTRAVENOUS | Status: DC
  Administered 2015-01-27 (×2): via INTRAVENOUS

## 2015-01-27 MED ORDER — WITCH HAZEL-GLYCERIN EX PADS: 1.0000 "application " | MEDICATED_PAD | CUTANEOUS | Status: DC | PRN

## 2015-01-27 MED ORDER — LANOLIN HYDROUS EX OINT: TOPICAL_OINTMENT | CUTANEOUS | Status: DC | PRN

## 2015-01-27 MED ORDER — MEASLES, MUMPS & RUBELLA VAC ~~LOC~~ INJ: 0.5000 mL | INJECTION | Freq: Once | SUBCUTANEOUS | Status: DC

## 2015-01-27 MED ORDER — BENZOCAINE-MENTHOL 20-0.5 % EX AERO
1.0000 "application " | INHALATION_SPRAY | CUTANEOUS | Status: DC | PRN
  Administered 2015-01-27: 1 via TOPICAL
  Filled 2015-01-27 (×2): qty 56

## 2015-01-27 MED ORDER — DIBUCAINE 1 % RE OINT: 1.0000 "application " | TOPICAL_OINTMENT | RECTAL | Status: DC | PRN

## 2015-01-27 MED ORDER — OXYCODONE-ACETAMINOPHEN 5-325 MG PO TABS: 2.0000 | ORAL_TABLET | ORAL | Status: DC | PRN

## 2015-01-27 MED ORDER — ONDANSETRON HCL 4 MG/2ML IJ SOLN: 4.0000 mg | INTRAMUSCULAR | Status: DC | PRN

## 2015-01-27 MED ORDER — OXYCODONE-ACETAMINOPHEN 5-325 MG PO TABS
1.0000 | ORAL_TABLET | ORAL | Status: DC | PRN
  Administered 2015-01-27 – 2015-01-29 (×5): 1 via ORAL
  Filled 2015-01-27 (×5): qty 1

## 2015-01-27 MED ORDER — ACETAMINOPHEN 325 MG PO TABS: 650.0000 mg | ORAL_TABLET | ORAL | Status: DC | PRN

## 2015-01-27 MED ORDER — DIPHENHYDRAMINE HCL 25 MG PO CAPS: 25.0000 mg | ORAL_CAPSULE | Freq: Four times a day (QID) | ORAL | Status: DC | PRN

## 2015-01-27 MED ORDER — SENNOSIDES-DOCUSATE SODIUM 8.6-50 MG PO TABS
2.0000 | ORAL_TABLET | ORAL | Status: DC
  Administered 2015-01-27 – 2015-01-28 (×2): 2 via ORAL
  Filled 2015-01-27 (×2): qty 2

## 2015-01-27 MED ORDER — SIMETHICONE 80 MG PO CHEW: 80.0000 mg | CHEWABLE_TABLET | ORAL | Status: DC | PRN

## 2015-01-27 MED ORDER — PRENATAL MULTIVITAMIN CH
1.0000 | ORAL_TABLET | Freq: Every day | ORAL | Status: DC
  Administered 2015-01-27 – 2015-01-29 (×3): 1 via ORAL
  Filled 2015-01-27 (×3): qty 1

## 2015-01-27 MED ORDER — ONDANSETRON HCL 4 MG PO TABS
4.0000 mg | ORAL_TABLET | ORAL | Status: DC | PRN
Start: 2015-01-27 — End: 2015-01-29

## 2015-01-27 MED ORDER — FERROUS SULFATE 325 (65 FE) MG PO TABS
325.0000 mg | ORAL_TABLET | Freq: Two times a day (BID) | ORAL | Status: DC
  Administered 2015-01-27 – 2015-01-29 (×4): 325 mg via ORAL
  Filled 2015-01-27 (×4): qty 1

## 2015-01-27 MED ORDER — ZOLPIDEM TARTRATE 5 MG PO TABS: 5.0000 mg | ORAL_TABLET | Freq: Every evening | ORAL | Status: DC | PRN

## 2015-01-27 MED ORDER — IBUPROFEN 600 MG PO TABS
600.0000 mg | ORAL_TABLET | Freq: Four times a day (QID) | ORAL | Status: DC
  Administered 2015-01-27 – 2015-01-29 (×9): 600 mg via ORAL
  Filled 2015-01-27 (×9): qty 1

## 2015-01-27 NOTE — Progress Notes (Signed)
S: c/o nausea S/p marked vomiting after low BP related to epidural No pitocin O: Blood pressure 110/59, pulse 75, temperature 98.3 F (36.8 C), temperature source Oral, resp. rate 20, height 5\' 7"  (1.702 m), weight 86.637 kg (191 lb), SpO2 100 %.  Abd gravid soft VE 2cm now 3-4 cm/60/-3/-2 AROM clear fluid ISE. /IUPC placed  Tracing: baseline 165-170 min variability ? Ctx  CBC Latest Ref Rng 01/26/2015 12/07/2014 07/15/2014  WBC 4.0 - 10.5 K/uL 14.5(H) 11.7(H) -  Hemoglobin 12.0 - 15.0 g/dL 16.113.7 09.613.2 04.512.0  Hematocrit 36.0 - 46.0 % 38.7 38.2 34  Platelets 150 - 400 K/uL 205 242 243    Oligohydramnios Fetal tachycardia  Term gestation Latent phase suspect poor contractile pattern Hypotension due to low BP( epidural) P) await adjustment for epidural issue. Oral juice. Start pitocin when feasible

## 2015-01-27 NOTE — Progress Notes (Signed)
Dr. Berneice HeinrichManny called back to bedside to evaluate pts bps. Pt vomiting and feeling light headed

## 2015-01-27 NOTE — Anesthesia Postprocedure Evaluation (Signed)
Anesthesia Post Note  Patient: Tammy PraderSarah A Forker  Procedure(s) Performed: * No procedures listed *  Anesthesia type: Epidural  Patient location: Mother/Baby  Post pain: Pain level controlled  Post assessment: Post-op Vital signs reviewed  Last Vitals:  Filed Vitals:   01/27/15 1500  BP: 115/52  Pulse: 71  Temp: 36.6 C  Resp: 22    Post vital signs: Reviewed  Level of consciousness:alert  Complications: No apparent anesthesia complications

## 2015-01-27 NOTE — Consult Note (Signed)
Neonatology Note:   Attendance at Delivery:    I was asked by Dr. Cherly Hensenousins to attend this vacuum-assisted vaginal delivery at term [facility]. The mother is a G3P0A2 A pos, GBS neg with a non-reactive NST yesterday, minimal FHR variability, and FHR decelerations during labor. Thee is a 2-VC and isolated choroid plexus cyst seen on fetal ultrasound. 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 She did not get any narcotic medications IV/IM during labor. ROM 6 hours prior to delivery, fluid clear. Infant was floppy at birth, with good HR, but no spontaneous resp effort, and cyanotic color. We bulb suctioned for scant fluid and gave vigorous stimulation, after which she started breathing irregularly. She maintained normal HR throughout. We gave BBO2 for 2-3 minutes. She gradually became more regular with breathing, and color and tone improved; she appeared normal at 5 minutes. Ap 4/9. Lungs clear to ausc in DR. A minimal amount of intermittent inspiratory stridor was noted at about 6 minutes, felt to possibly be due to secretions, but not causing any distress; observe for now. To CN to care of Pediatrician.   Doretha Souhristie C. Samarrah Tranchina, MD

## 2015-01-27 NOTE — Progress Notes (Signed)
Called for fully dilated /+1 station along with repetitive hypotension  O: on arrival. BP 88/60  Pitocin off Epidural off Tracing reviewed baseline 180 with some deep variable decels and some late decels Ctx q 3-4 mins VE fully +1  station IMP: complete Term Deep variables P) anesthesia called. Start pushing. Disc poss vacuum if tol pushing and low enough. Risk reviewed. Neonatology called to attend as well

## 2015-01-27 NOTE — Progress Notes (Signed)
Dr. Berneice HeinrichManny at bedside placing epidural aware of pts bp

## 2015-01-27 NOTE — Addendum Note (Signed)
Addendum  created 01/27/15 1835 by Algis GreenhouseLinda A Uldine Fuster, CRNA   Modules edited: Charges VN, Notes Section   Notes Section:  File: [id number]

## 2015-01-27 NOTE — Lactation Note (Signed)
This note was copied from the chart of Tammy Maurice MarchSarah Daponte. Lactation Consultation Note: Initial visit. Baby has just had bath and is showing feeding cues. Reviewed with mom. Assisted with baby baby latched well and nursed for 25 min total. Encouraged mom to keep baby skin to skin. BF brochure given with resources for support after DC. Reviewed BFGS and OP appointments. No questions at present. To call prn  Patient Name: Tammy Horne Today's Date: 01/27/2015 Reason for consult: Initial assessment   Maternal Data Formula Feeding for Exclusion: No Does the patient have breastfeeding experience prior to this delivery?: No  Feeding Feeding Type: Breast Fed  LATCH Score/Interventions Latch: Grasps breast easily, tongue down, lips flanged, rhythmical sucking.  Audible Swallowing: A few with stimulation  Type of Nipple: Everted at rest and after stimulation  Comfort (Breast/Nipple): Soft / non-tender     Hold (Positioning): Assistance needed to correctly position infant at breast and maintain latch. Intervention(s): Breastfeeding basics reviewed;Support Pillows;Skin to skin  LATCH Score: 8  Lactation Tools Discussed/Used     Consult Status Consult Status: Follow-up Date: 01/28/15 Follow-up type: In-patient    Pamelia HoitWeeks, Aalyah Mansouri D 01/27/2015, 3:48 PM

## 2015-01-28 ENCOUNTER — Inpatient Hospital Stay (HOSPITAL_COMMUNITY): Admission: RE | Admit: 2015-01-28 | Payer: 59 | Source: Ambulatory Visit

## 2015-01-28 LAB — CBC
HEMATOCRIT: 28.4 % — AB (ref 36.0–46.0)
Hemoglobin: 10 g/dL — ABNORMAL LOW (ref 12.0–15.0)
MCH: 32.5 pg (ref 26.0–34.0)
MCHC: 34.5 g/dL (ref 30.0–36.0)
MCV: 94 fL (ref 78.0–100.0)
PLATELETS: 140 10*3/uL — AB (ref 150–400)
RBC: 3.02 MIL/uL — ABNORMAL LOW (ref 3.87–5.11)
RDW: 14 % (ref 11.5–15.5)
WBC: 13.4 10*3/uL — ABNORMAL HIGH (ref 4.0–10.5)

## 2015-01-28 MED ORDER — MAGNESIUM 200 MG PO TABS
200.0000 mg | ORAL_TABLET | Freq: Every day | ORAL | Status: DC
  Administered 2015-01-28 – 2015-01-29 (×2): 200 mg via ORAL
  Filled 2015-01-28 (×3): qty 1

## 2015-01-28 NOTE — Progress Notes (Signed)
CLINICAL SOCIAL WORK MATERNAL/CHILD NOTE  Patient Details  Name: Tammy Horne MRN: [id number] Date of Birth: 01/27/2014  Date:  01/28/2015  Clinical Social Worker Initiating Note:  Loleta BooksSarah Lebert Lovern, LCSW Date/ Time Initiated:  01/28/15/0930     Child's Name:  unnamed at time of assessment   Legal Guardian:  Tammy Horne and [name]   Need for Interpreter:  None   Date of Referral:  01/27/15     Reason for Referral:  History of anxiety   [facility]   Address:  [address] [place], [place] [address]  Phone number:  [phone]   Household Members:  Spouse   Natural Supports (not living in the home):  Extended Family, Immediate Family, Neighbors.  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  CSW noted no acute anxiety as she reflected upon her transition to motherhood. She denied acute anxiety as she anticipates returning home with the infant, but stated that it continues to feel surreal.    Mood/Affect:  Happy , Interested , Bright    CSW Assessment:  MOB's sister was in the room at the time of the assessment, but MOB provided consent for CSW to continue.  MOB receptive to discussing her mental health history. Per MOB, she presents with chronic history of anxiety, and was prescribed Prozac and Xanax until she learned of the pregnancy. She shared that it was a difficult transition off of her medications, and shared that she noted  an increase in anxiety and panic attacks during the pregnancy without the medications. She endorsed anxiety secondary to stressful work environment and stated that she had intense anxiety about giving birth.  She stated that she feels better now that the baby has been born, and she denied current anxieties secondary to providing normal infant care. She appeared relaxed as she interacted with the infant and attempted to breastfeed.  MOB stated that she does not intend to restart her medications at this time since she wants to breastfeed.   MOB stated that she feels confident in her ability to identify when her anxiety is worsening, including the onset of panic attacks.  She reported that despite the self-awareness, she has a difficult time self-regulating once the anxiety worsens.  Per MOB, she has to "ride it out" when she has an anxiety attacks.  She stated that she has already contacted a neighbor who has offered to help out if she needs extra support/a break.  She endorsed strong family support, including her fiance who is aware of her mental health needs.  MOB receptive to education on postpartum depression/anxiety, and acknowledged the need to closely monitor her mood in the postpartum period since she has a history of anxiety and experienced symptoms during the pregnancy.   CSW Plan/Description:   1) Patient/Family Education: CSW provided education on postpartum depression/anxiety. MOB is aware of her increased risk for developing postpartum depression/anxiety due to her mental health history. She stated that Dr.  Cherly Hensen is aware of her mental health history, and will be a resource for her in the postpartum period.   2)No Further Intervention Required/No Barriers to Discharge    [name], [name] 01/28/2015, 10:44 AM

## 2015-01-28 NOTE — Progress Notes (Signed)
Patient ID: Vilma PraderSarah A Mancera, female   DOB: 09/04/1971, 44 y.o.   MRN: [id number] PPD # 1 VAVD  S:  Reports feeling sleepy, but well             Tolerating po/ No nausea or vomiting             Bleeding is light             Pain controlled with ibuprofen (OTC)             Up ad lib / ambulatory / voiding without difficulties    Newborn  Information for the patient's newborn:  Sallee ProvencalKeith, Girl Delmi [[id number]][[id number]]  female  breast feeding     O:  A & O x 3, in no apparent distress              VS:  Filed Vitals:   01/27/15 1000 01/27/15 1500 01/27/15 2200 01/28/15 0627  BP: 110/58 115/52 105/55 98/51  Pulse: 91 71 67 67  Temp: 99.3 F (37.4 C) 97.8 F (36.6 C) 97.3 F (36.3 C) 97.5 F (36.4 C)  TempSrc: Oral Oral Oral Oral  Resp: 20 22 18 18   Height:      Weight:      SpO2:  98% 96% 98%    LABS:  Recent Labs  01/26/15 2230 01/28/15 0545  WBC 14.5* 13.4*  HGB 13.7 10.0*  HCT 38.7 28.4*  PLT 205 140*    Blood type: A POS (04/26 2230)  Rubella: Immune, Equivocal (10/14 0000)   I&O: I/O last 3 completed shifts: In: -  Out: 3000 [Urine:2650; Blood:350]             Lungs: Clear and unlabored  Heart: regular rate and rhythm / no murmurs  Abdomen: soft, non-tender, non-distended              Fundus: firm, non-tender, U-2  Perineum: 2nd degree repair healing well  Lochia: minimal  Extremities: No edema, no calf pain or tenderness, no Homans    A/P: PPD # 1  44 y.o., [id number]   Principal Problem:    Vacuum extractor delivery, delivered (4/27)  Active Problems:    Oligohydramnios    Postpartum care following vaginal delivery   Doing well - stable status  Routine post partum orders  Anticipate discharge tomorrow    Raelyn MoraAWSON, Anaisha Mago, M, MSN, CNM 01/28/2015, 9:40 AM

## 2015-01-28 NOTE — Lactation Note (Signed)
This note was copied from the chart of Tammy Maurice MarchSarah Olshefski. Lactation Consultation Note  Patient Name: Tammy Maurice MarchSarah Horne NFAOZ'HToday's Date: 01/28/2015 Reason for consult: Follow-up assessment;Breast/nipple pain  Mom requesting help with latching baby as she has been experiencing more pain than yesterday.  Nipples are pink but skin remains intact.  Demonstrated manual breast expression to use colostrum on nipples post breast feeding.  Mom started using cradle hold, but assisted her to switching to cross cradle.  Mom felt uncomfortable, so switched to football hold.  Assisted Mom in attaining a deeper areolar latch.  Discomfort felt for about 30 seconds, and then decreased to a 3 level of pain.  Baby remained nutritive and swallows heard regularly.  Reviewed importance of using breast support and bringing baby to the breast only after mouth is open widely.  Mom feels a lot better.  Encouraged Mom to break suction before taking baby off the breast.  Encouraged to call for assistance from her bedside RN prn.  To follow up in am or prn.      Consult Status Consult Status: Follow-up Date: 01/29/15 Follow-up type: In-patient    Judee ClaraSmith, Koral Thaden E 01/28/2015, 4:59 PM

## 2015-01-29 MED ORDER — OXYCODONE-ACETAMINOPHEN 5-325 MG PO TABS: 1.0000 | ORAL_TABLET | ORAL | Status: DC | PRN

## 2015-01-29 MED ORDER — FERROUS SULFATE 325 (65 FE) MG PO TABS: 325.0000 mg | ORAL_TABLET | Freq: Every day | ORAL | Status: DC

## 2015-01-29 MED ORDER — IBUPROFEN 600 MG PO TABS: 600.0000 mg | ORAL_TABLET | Freq: Four times a day (QID) | ORAL | Status: DC

## 2015-01-29 NOTE — Lactation Note (Signed)
This note was copied from the chart of Tammy Maurice MarchSarah Horne. Lactation Consultation Note: Follow up visit with mom before DC. She reports baby is nursing well and she is getting a better latch. Nipples no worse than yesterday. Reports breasts are feeling fuller this morning. Reviewed engorgement prevention and treatment. Has DEBP at home. No questions at present. Reviewed BFSG and OP appointments as resources for support after DC To call prn  Patient Name: Tammy Maurice MarchSarah Horne AVWUJ'WToday's Date: 01/29/2015 Reason for consult: Follow-up assessment   Maternal Data Formula Feeding for Exclusion: No Does the patient have breastfeeding experience prior to this delivery?: No  Feeding   LATCH Score/Interventions   Lactation Tools Discussed/Used     Consult Status Consult Status: Complete    Pamelia HoitWeeks, Argie Lober D 01/29/2015, 9:39 AM

## 2015-01-29 NOTE — Progress Notes (Addendum)
PPD #2- SVD  Subjective:   Reports feeling well, ready for discharge Tolerating po/ No nausea or vomiting Bleeding is light Pain controlled with Motrin and Percocet Up ad lib / ambulatory / voiding without problems Newborn: breastfeeding     Objective:   VS: VS:  Filed Vitals:   01/27/15 1500 01/27/15 2200 01/28/15 0627 01/29/15 0558  BP: 115/52 105/55 98/51 110/60  Pulse: 71 67 67 60  Temp: 97.8 F (36.6 C) 97.3 F (36.3 C) 97.5 F (36.4 C) 97.9 F (36.6 C)  TempSrc: Oral Oral Oral Oral  Resp: _0 Height:      Weight:      SpO2: 98% 96% 98%     LABS:  Recent Labs  01/26/15 2230 01/28/15 0545  WBC 14.5* 13.4*  HGB 13.7 10.0*  PLT 205 140*   Blood type: --/--/A POS, A POS (04/26 2230) Rubella: Immune, Equivocal (10/14 0000)                I&O: Intake/Output      04/28 0701 - 04/29 0700 04/29 0701 - 04/30 0700   Urine (mL/kg/hr)     Total Output       Net              Physical Exam: Alert and oriented X3 Abdomen: soft, non-tender, non-distended  Fundus: firm, non-tender, U-2 Perineum: Well approximated, no significant erythema, edema, or drainage; healing well. Lochia: small Extremities: no edema, no calf pain or tenderness    Assessment: PPD #2  G3P1021/ S/P: SVD, 2nd degree laceration, periurethral laceration Rubella Non-immune Doing well - stable for discharge home   Plan: MMR prior to discharge Discharge home RX's:  Ibuprofen 615m po Q 6 hrs prn pain #30 Refill x 0 Percocet 5/325 1 to 2 po Q 4 hrs prn pain #30 Refill x 0 Routine pp visit in [facility] booklet given    BJulianne Handler N MSN, CNM 01/29/2015, 9:49 AM

## 2015-01-29 NOTE — Discharge Summary (Signed)
Obstetric Discharge Summary Reason for Admission: induction of labor for BPP 6/8, oligohydramnios Prenatal Procedures: ultrasound Intrapartum Procedures: VAVD, epidural Postpartum Procedures: Rubella Ig Complications-Operative and Postpartum: 2nd degree perineal laceration, periurethral laceration HEMOGLOBIN  Date Value Ref Range Status  01/28/2015 10.0* 12.0 - 15.0 g/dL Final    Comment:    DELTA CHECK NOTED REPEATED TO VERIFY   07/15/2014 12.0 g/dL Final   HCT  Date Value Ref Range Status  01/28/2015 28.4* 36.0 - 46.0 % Final  07/15/2014 34 % Final    Physical Exam:  General: alert and cooperative Lochia: appropriate Uterine Fundus: firm Incision: healing well, no significant drainage, no dehiscence, no significant erythema DVT Evaluation: No evidence of DVT seen on physical exam. Negative Homan's sign. No cords or calf tenderness. No significant calf/ankle edema.  Discharge Diagnoses: Term Pregnancy-delivered, S/p VAVD, Mild anemia, Rubella non-immune  Discharge Information: Date: 01/29/2015 Activity: pelvic rest Diet: routine Medications: PNV and Ibuprofen Condition: stable Instructions: refer to practice specific booklet Discharge to: home Follow-up Information    Follow up with Egan Sahlin A, MD. Schedule an appointment as soon as possible for a visit in 6 weeks.   Specialty:  Obstetrics and Gynecology   Contact information:   [address] [place] [id number] [phone]       Newborn Data: Live born female on 01/2715 Birth Weight: 7 lb 14.8 oz (3595 g) APGAR: 4, 9  Home with mother.  BHAMBRI, MELANIE, N 01/29/2015, 9:56 AM

## 2015-07-07 ENCOUNTER — Encounter: Payer: Self-pay | Admitting: Family Medicine

## 2015-07-07 ENCOUNTER — Ambulatory Visit (INDEPENDENT_AMBULATORY_CARE_PROVIDER_SITE_OTHER): Payer: BLUE CROSS/BLUE SHIELD | Admitting: Family Medicine

## 2015-07-07 VITALS — BP 124/79 | HR 60 | Temp 98.3°F | Resp 16 | Ht 66.5 in | Wt 176.6 lb

## 2015-07-07 DIAGNOSIS — R5382 Chronic fatigue, unspecified: Secondary | ICD-10-CM | POA: Diagnosis not present

## 2015-07-07 DIAGNOSIS — R1013 Epigastric pain: Secondary | ICD-10-CM

## 2015-07-07 DIAGNOSIS — D649 Anemia, unspecified: Secondary | ICD-10-CM | POA: Diagnosis not present

## 2015-07-07 LAB — POCT URINALYSIS DIP (MANUAL ENTRY)
Bilirubin, UA: NEGATIVE
Blood, UA: NEGATIVE
Glucose, UA: NEGATIVE
Ketones, POC UA: NEGATIVE
Nitrite, UA: NEGATIVE
PROTEIN UA: NEGATIVE
SPEC GRAV UA: 1.015
UROBILINOGEN UA: 0.2
pH, UA: 6.5

## 2015-07-07 LAB — CBC
HCT: 37.1 % (ref 36.0–46.0)
HEMOGLOBIN: 13 g/dL (ref 12.0–15.0)
MCH: 31.3 pg (ref 26.0–34.0)
MCHC: 35 g/dL (ref 30.0–36.0)
MCV: 89.4 fL (ref 78.0–100.0)
MPV: 10.5 fL (ref 8.6–12.4)
Platelets: 253 10*3/uL (ref 150–400)
RBC: 4.15 MIL/uL (ref 3.87–5.11)
RDW: 13.4 % (ref 11.5–15.5)
WBC: 6.3 10*3/uL (ref 4.0–10.5)

## 2015-07-07 LAB — COMPREHENSIVE METABOLIC PANEL
ALT: 674 U/L — ABNORMAL HIGH (ref 6–29)
AST: 748 U/L — ABNORMAL HIGH (ref 10–30)
Albumin: 4.2 g/dL (ref 3.6–5.1)
Alkaline Phosphatase: 121 U/L — ABNORMAL HIGH (ref 33–115)
BUN: 18 mg/dL (ref 7–25)
CALCIUM: 8.6 mg/dL (ref 8.6–10.2)
CHLORIDE: 106 mmol/L (ref 98–110)
CO2: 24 mmol/L (ref 20–31)
CREATININE: 0.63 mg/dL (ref 0.50–1.10)
Glucose, Bld: 109 mg/dL — ABNORMAL HIGH (ref 65–99)
Potassium: 4 mmol/L (ref 3.5–5.3)
SODIUM: 138 mmol/L (ref 135–146)
Total Bilirubin: 0.4 mg/dL (ref 0.2–1.2)
Total Protein: 6.7 g/dL (ref 6.1–8.1)

## 2015-07-07 LAB — POC MICROSCOPIC URINALYSIS (UMFC): Mucus: ABSENT

## 2015-07-07 LAB — AMYLASE: Amylase: 633 U/L — ABNORMAL HIGH (ref 0–105)

## 2015-07-07 LAB — LIPASE: LIPASE: 1845 U/L — AB (ref 7–60)

## 2015-07-07 LAB — TSH: TSH: 1.282 u[IU]/mL (ref 0.350–4.500)

## 2015-07-07 NOTE — Patient Instructions (Signed)
Cholecystitis Cholecystitis is inflammation of the gallbladder. It is often called a gallbladder attack. The gallbladder is a pear-shaped organ that lies beneath the liver on the right side of the body. The gallbladder stores bile, which is a fluid that helps the body to digest fats. If bile builds up in your gallbladder, your gallbladder becomes inflamed. This condition may occur suddenly (be acute). Repeat episodes of acute cholecystitis or prolonged episodes may lead to a long-term (chronic) condition. Cholecystitis is serious and it requires treatment.  CAUSES The most common cause of this condition is gallstones. Gallstones can block the tube (duct) that carries bile out of your gallbladder. This causes bile to build up. Other causes of this condition include:  Damage to the gallbladder due to a decrease in blood flow.  Infections in the bile ducts.  Scars or kinks in the bile ducts.  Tumors in the liver, pancreas, or gallbladder. RISK FACTORS This condition is more likely to develop in:  People who have sickle cell disease.  People who take birth control pills or use estrogen.  People who have alcoholic liver disease.  People who have liver cirrhosis.  People who have their nutrition delivered through a vein (parenteral nutrition).  People who do not eat or drink (do fasting) for a long period of time.  People who are obese.  People who have rapid weight loss.  People who are pregnant.  People who have increased triglyceride levels.  People who have pancreatitis. SYMPTOMS Symptoms of this condition include:  Abdominal pain, especially in the upper right area of the abdomen.  Abdominal tenderness or bloating.  Nausea.  Vomiting.  Fever.  Chills.  Yellowing of the skin and the whites of the eyes (jaundice). DIAGNOSIS This condition is diagnosed with a medical history and physical exam. You may also have other tests, including:  Imaging tests, such as:  An  ultrasound of the gallbladder.  A CT scan of the abdomen.  A gallbladder nuclear scan (HIDA scan). This scan allows your health care provider to see the bile moving from your liver to your gallbladder and to your small intestine.  MRI.  Blood tests, such as:  A complete blood count, because the white blood cell count may be higher than normal.  Liver function tests, because some levels may be higher than normal with certain types of gallstones. TREATMENT Treatment may include:  Fasting for a certain amount of time.  IV fluids.  Medicine to treat pain or vomiting.  Antibiotic medicine.  Surgery to remove your gallbladder (cholecystectomy). This may happen immediately or at a later time. HOME CARE INSTRUCTIONS Home care will depend on your treatment. In general:  Take over-the-counter and prescription medicines only as told by your health care provider.  If you were prescribed an antibiotic medicine, take it as told by your health care provider. Do not stop taking the antibiotic even if you start to feel better.  Follow instructions from your health care provider about what to eat or drink. When you are allowed to eat, avoid eating or drinking anything that triggers your symptoms.  Keep all follow-up visits as told by your health care provider. This is important. SEEK MEDICAL CARE IF:  Your pain is not controlled with medicine.  You have a fever. SEEK IMMEDIATE MEDICAL CARE IF:  Your pain moves to another part of your abdomen or to your back.  You continue to have symptoms or you develop new symptoms even with treatment.   This information   is not intended to replace advice given to you by your health care provider. Make sure you discuss any questions you have with your health care provider.   Document Released: 09/18/2005 Document Revised: 06/09/2015 Document Reviewed: 12/30/2014 Elsevier Interactive Patient Education 2016 Elsevier Inc.  

## 2015-07-07 NOTE — Progress Notes (Signed)
Subjective:    Patient ID: Tammy Horne, female    DOB: 1971/07/10, 44 y.o.   MRN: [id number]  HPI This is a pleasant 44 yo female who presents today with intermittent abdominal pain. It started during her 7th month of her pregnancy. Her daughter is 4 months old. She has had 6 episodes. She was seen by her gyn following an "attack" several months ago and gyn exam was negative. She had a little blood in her urine and was told the pain could be from a kidney stone or her gallbladder. She was given some percocet for pain and instructed to follow up with PCP if it occurred again.   She had an attack last night. She had cookies and ice cream earlier in the day. It lasted for several hours and was relieved with percocet x 2. She did not have vomiting. Pain from mid upper abdomen to back. She was able to sleep last night once pain resolved. She feels sore this morning, but pain resolved. No fever, No diarrhea, no constipation. No blood or mucous in stool, no dark stools. Has not been sexually active since the birth of her daughter. Is currently breast feeding.   She has been chronically fatigued since prior to her pregnancy. She has attributed this to working long hours and being depressed. Her sister has thyroid disease and the patient would like to be screened.   Past Medical History  Diagnosis Date  . Allergy   . Anemia   . Anxiety   . Arthritis   . Asthma   . Pregnancy with abdominal pain of right lower quadrant, antepartum 01/12/2015  . AMA (advanced maternal age) primigravida 60+    Past Surgical History  Procedure Laterality Date  . Lipo suction     Family History  Problem Relation Age of Onset  . Diabetes Mother   . Hypertension Mother   . Cancer Cousin     breast   Social History  Substance Use Topics  . Smoking status: Former Games developer  . Smokeless tobacco: None  . Alcohol Use: No   Review of Systems Per HPI    Objective:   Physical Exam  Constitutional: She is oriented to  person, place, and time. She appears well-developed and well-nourished. No distress.  Looks tired.   HENT:  Head: Normocephalic and atraumatic.  Eyes: Conjunctivae are normal.  Cardiovascular: Normal rate, regular rhythm and normal heart sounds.   Pulmonary/Chest: Effort normal and breath sounds normal.  Abdominal: Soft. Bowel sounds are normal. She exhibits no distension and no mass. There is no hepatosplenomegaly. There is tenderness (mild tenderness to deep palpation epigastric region) in the epigastric area. There is no rigidity, no rebound, no guarding, no CVA tenderness, no tenderness at McBurney's point and negative Murphy's sign.  Musculoskeletal: Normal range of motion.  Neurological: She is alert and oriented to person, place, and time.  Skin: Skin is warm and dry. She is not diaphoretic.  Psychiatric: She has a normal mood and affect. Her behavior is normal. Judgment and thought content normal.  Vitals reviewed.  BP 124/79 mmHg  Pulse 60  Temp(Src) 98.3 F (36.8 C) (Oral)  Resp 16  Ht 5' 6.5" (1.689 m)  Wt 176 lb 9.6 oz (80.105 kg)  BMI 28.08 kg/m2  Breastfeeding? Yes     Assessment & Plan:  1. Epigastric pain - US Abdomen Complete; Future - CBC - Comprehensive metabolic panel - Lipase - Amylase - POCT urinalysis dipstick - POCT Microscopic Urinalysis (  [facility])  2. Anemia, unspecified anemia type - CBC  3. Chronic fatigue - Vit D  25 hydroxy (rtn osteoporosis monitoring) - TSH  - She was instructed to go to ER if she has further pain or develops a fever.   Olean Ree, FNP-BC  [facility], [facility]  07/08/2015 8:59 PM

## 2015-07-08 ENCOUNTER — Other Ambulatory Visit: Payer: Self-pay | Admitting: Family Medicine

## 2015-07-08 ENCOUNTER — Telehealth: Payer: Self-pay | Admitting: Family Medicine

## 2015-07-08 DIAGNOSIS — R1013 Epigastric pain: Principal | ICD-10-CM

## 2015-07-08 DIAGNOSIS — R7989 Other specified abnormal findings of blood chemistry: Secondary | ICD-10-CM

## 2015-07-08 DIAGNOSIS — G8929 Other chronic pain: Secondary | ICD-10-CM

## 2015-07-08 DIAGNOSIS — R945 Abnormal results of liver function studies: Secondary | ICD-10-CM

## 2015-07-08 LAB — VITAMIN D 25 HYDROXY (VIT D DEFICIENCY, FRACTURES): Vit D, 25-Hydroxy: 24 ng/mL — ABNORMAL LOW (ref 30–100)

## 2015-07-08 NOTE — Telephone Encounter (Signed)
Spoke with patient regarding her lab results and concern for possible pancreatitis. She reports that she feel fine today, that her abdomen was sore yesterday, but that she has had no further pain. She has not had a fever. She was instructed to have a clear liquid diet until the results of her ultra sound were known. She was instructed to go to the ER if she has any abdominal pain or fever.

## 2015-07-08 NOTE — Telephone Encounter (Signed)
Pt has an appt tomorrow at [facility] @ 11. Pt to arrive at 1045. NPO for 6 hrs. Pt notified and I notified her of what her labs showed and why we were doing the [place]

## 2015-07-08 NOTE — Telephone Encounter (Signed)
Called and left patient a message regarding labs- for her to return my call. Labs suggest pancreatitis. Working on scheduling her abdominal US for today or tomorrow if she is feeling ok, otherwise will recommend she go to ER.

## 2015-07-09 ENCOUNTER — Ambulatory Visit (HOSPITAL_COMMUNITY): Payer: 59

## 2015-07-09 ENCOUNTER — Other Ambulatory Visit: Payer: Self-pay | Admitting: *Deleted

## 2015-07-09 ENCOUNTER — Ambulatory Visit (HOSPITAL_COMMUNITY)
Admission: RE | Admit: 2015-07-09 | Discharge: 2015-07-09 | Disposition: A | Discharge status: Home / Self Care | Payer: BLUE CROSS/BLUE SHIELD | Source: Ambulatory Visit | Attending: Family Medicine | Admitting: Family Medicine

## 2015-07-09 ENCOUNTER — Other Ambulatory Visit (INDEPENDENT_AMBULATORY_CARE_PROVIDER_SITE_OTHER): Payer: BLUE CROSS/BLUE SHIELD

## 2015-07-09 DIAGNOSIS — R945 Abnormal results of liver function studies: Secondary | ICD-10-CM | POA: Insufficient documentation

## 2015-07-09 DIAGNOSIS — R1013 Epigastric pain: Secondary | ICD-10-CM

## 2015-07-09 DIAGNOSIS — K8020 Calculus of gallbladder without cholecystitis without obstruction: Secondary | ICD-10-CM | POA: Insufficient documentation

## 2015-07-09 DIAGNOSIS — G8929 Other chronic pain: Secondary | ICD-10-CM

## 2015-07-09 DIAGNOSIS — R109 Unspecified abdominal pain: Secondary | ICD-10-CM | POA: Diagnosis present

## 2015-07-09 DIAGNOSIS — R7989 Other specified abnormal findings of blood chemistry: Secondary | ICD-10-CM

## 2015-07-09 DIAGNOSIS — R932 Abnormal findings on diagnostic imaging of liver and biliary tract: Secondary | ICD-10-CM | POA: Insufficient documentation

## 2015-07-09 LAB — LIPASE: Lipase: 35 U/L (ref 7–60)

## 2015-07-09 LAB — HEPATIC FUNCTION PANEL
ALK PHOS: 98 U/L (ref 33–115)
ALT: 246 U/L — ABNORMAL HIGH (ref 6–29)
AST: 53 U/L — AB (ref 10–30)
Albumin: 4.3 g/dL (ref 3.6–5.1)
BILIRUBIN DIRECT: 0.1 mg/dL (ref <=0.2)
Indirect Bilirubin: 0.6 mg/dL (ref 0.2–1.2)
Total Bilirubin: 0.7 mg/dL (ref 0.2–1.2)
Total Protein: 7 g/dL (ref 6.1–8.1)

## 2015-07-09 LAB — AMYLASE: AMYLASE: 35 U/L (ref 0–105)

## 2015-07-09 NOTE — Progress Notes (Signed)
Pt is here for lab work only. 

## 2015-07-10 ENCOUNTER — Telehealth: Payer: Self-pay | Admitting: Family Medicine

## 2015-07-10 NOTE — Telephone Encounter (Signed)
Called patient regarding her ultrasound results. Patient is not having fever or pain. Based on results, will recheck labs today and refer to general surgery. Patient instructed to advance her diet from clear liquid and can eat bland foods, avoiding greasy or heavy foods. She was instructed to go to ER if she has recurrent pain or develops a fever.

## 2015-07-12 ENCOUNTER — Other Ambulatory Visit: Payer: 59

## 2015-07-13 ENCOUNTER — Other Ambulatory Visit: Payer: 59

## 2015-07-14 ENCOUNTER — Ambulatory Visit: Payer: Self-pay | Admitting: General Surgery

## 2015-07-22 ENCOUNTER — Telehealth: Payer: Self-pay

## 2015-07-22 DIAGNOSIS — K8020 Calculus of gallbladder without cholecystitis without obstruction: Secondary | ICD-10-CM

## 2015-07-22 MED ORDER — OXYCODONE-ACETAMINOPHEN 5-325 MG PO TABS: 1.0000 | ORAL_TABLET | ORAL | Status: DC | PRN

## 2015-07-22 NOTE — Telephone Encounter (Signed)
Pt is needing to talk with someone about getting pain meds til she gets her pre op appt for surgery

## 2015-07-22 NOTE — Telephone Encounter (Signed)
Scheduled for pre-op nov 9th, 2016 - [facility]gram [facility]. Taking percocet for flares, about once a week. She had a flare last night and did not have any medications last night. Original rx by GYN prescribed 9/1 -  Checks out on [place] controlled substance database. Rx printed - pt aware that it is ready for pickup.

## 2015-07-22 NOTE — Telephone Encounter (Signed)
Does pt have to come in for this? 

## 2015-07-22 NOTE — Telephone Encounter (Signed)
Called pt and LMOM to CB. Need more information -- when is her appt with general surgery? What is she taking for pain now and how is she taking these meds?

## 2015-09-07 ENCOUNTER — Ambulatory Visit: Payer: Self-pay | Admitting: General Surgery

## 2015-09-07 NOTE — H&P (Signed)
History of Present Illness Tammy Filler MD; 09/07/2015 11:25 AM) The patient is a 44 year old female who presents for evaluation of gall stones. The patient is a 44 year old female with symptomatic cholelithiasis who is referred by Sherran Needs, FNP. Patient recently had a child. She states that the right upper quadrant pain began during her pregnancy. The patient does state that she notices pain in the right upper quadrant with a high fatty meal.  The patient is still breast-feeding.   Other Problems Fay Records, CMA; 09/07/2015 11:13 AM) Anxiety Disorder Asthma Back Pain Bladder Problems Breast Cancer Cholelithiasis Lump In Breast  Past Surgical History Fay Records, CMA; 09/07/2015 11:13 AM) Breast Biopsy Left. Breast Mass; Local Excision Left.  Diagnostic Studies History Fay Records, [place]; 09/07/2015 11:13 AM) Colonoscopy never Mammogram 1-3 years ago Pap Smear 1-5 years ago  Allergies Fay Records, CMA; 09/07/2015 11:14 AM) No Known Drug Allergies12/03/2015  Medication History Fay Records, CMA; 09/07/2015 11:14 AM) FLUoxetine HCl (  Capsule, Oral) Active. Oxycodone-Acetaminophen (5-325MG  Tablet, Oral) Active. Medications Reconciled ValACYclovir HCl (  Tablet, Oral) Active.  Social History Fay Records, [place]; 09/07/2015 11:13 AM) Alcohol use Occasional alcohol use. Caffeine use Coffee. No drug use Tobacco use Former smoker.  Family History Fay Records, [place]; 09/07/2015 11:13 AM) Arthritis Mother. Depression Sister. Diabetes Mellitus Mother. Heart Disease Family Members In General. Heart disease in female family member before age 37 Heart disease in female family member before age 37 Hypertension Family Members In General. Melanoma Family Members In General. Prostate Cancer Father. Seizure disorder Sister. Thyroid problems Family Members In General.  Pregnancy / Birth History Fay Records, CMA; 09/07/2015 11:13 AM) Age at  menarche 15 years. Contraceptive History Oral contraceptives. Gravida 2 Maternal age 50-20 Para 1 Regular periods    Review of Systems Fay Records CMA; 09/07/2015 11:13 AM) General Present- Fatigue. Not Present- Appetite Loss, Chills, Fever, Night Sweats, Weight Gain and Weight Loss. HEENT Present- Nose Bleed, Seasonal Allergies and Wears glasses/contact lenses. Not Present- Earache, Hearing Loss, Hoarseness, Oral Ulcers, Ringing in the Ears, Sinus Pain, Sore Throat, Visual Disturbances and Yellow Eyes. Respiratory Present- Snoring. Not Present- Bloody sputum, Chronic Cough, Difficulty Breathing and Wheezing. Cardiovascular Not Present- Chest Pain, Difficulty Breathing Lying Down, Leg Cramps, Palpitations, Rapid Heart Rate, Shortness of Breath and Swelling of Extremities. Gastrointestinal Present- Abdominal Pain and Gets full quickly at meals. Not Present- Bloating, Bloody Stool, Change in Bowel Habits, Chronic diarrhea, Constipation, Difficulty Swallowing, Excessive gas, Hemorrhoids, Indigestion, Nausea, Rectal Pain and Vomiting. Female Genitourinary Present- Frequency and Urgency. Not Present- Nocturia, Painful Urination and Pelvic Pain. Musculoskeletal Present- Back Pain and Joint Stiffness. Not Present- Joint Pain, Muscle Pain, Muscle Weakness and Swelling of Extremities. Neurological Not Present- Decreased Memory, Fainting, Headaches, Numbness, Seizures, Tingling, Tremor, Trouble walking and Weakness. Psychiatric Present- Anxiety and Depression. Not Present- Bipolar, Change in Sleep Pattern, Fearful and Frequent crying. Endocrine Present- Hair Changes. Not Present- Cold Intolerance, Excessive Hunger, Heat Intolerance, Hot flashes and New Diabetes. Hematology Not Present- Easy Bruising, Excessive bleeding, Gland problems, HIV and Persistent Infections.  Vitals Fay Records CMA; 09/07/2015 11:14 AM) 09/07/2015 11:14 AM Weight: 178 lb Height: 66in Body Surface Area: 1.9 m Body  Mass Index: 28.73 kg/m  Temp.: 97.39F(Temporal)  Pulse: 82 (Regular)  BP: 130/68 (Sitting, Left Arm, Standard)       Physical Exam Tammy Filler, MD; 09/07/2015 11:26 AM) General Mental Status-Alert. General Appearance-Consistent with stated age. Hydration-Well hydrated. Voice-Normal.  Head and Neck Head-normocephalic, atraumatic with no lesions or palpable masses.  Eye Eyeball - Bilateral-Extraocular movements intact. Sclera/Conjunctiva - Bilateral-No scleral icterus.  Chest and Lung Exam Chest and lung exam reveals -quiet, even and easy respiratory effort with no use of accessory muscles. Inspection Chest Wall - Normal. Back - normal.  Cardiovascular Cardiovascular examination reveals -normal heart sounds, regular rate and rhythm with no murmurs.  Abdomen Inspection Normal Exam - No Hernias. Palpation/Percussion Normal exam - Soft, Non Tender, No Rebound tenderness, No Rigidity (guarding) and No hepatosplenomegaly. Auscultation Normal exam - Bowel sounds normal.  Neurologic Neurologic evaluation reveals -alert and oriented x 3 with no impairment of recent or remote memory. Mental Status-Normal.  Musculoskeletal Normal Exam - Left-Upper Extremity Strength Normal and Lower Extremity Strength Normal. Normal Exam - Right-Upper Extremity Strength Normal, Lower Extremity Weakness.    Assessment & Plan Tammy Horne(Arlon Bleier MD; 09/07/2015 11:25 AM) SYMPTOMATIC CHOLELITHIASIS (K80.20) Impression: 44 year old female with symptomatic cholelithiasis.  1. Will proceed to the operating for a laparoscopic cholecystectomy 2. Risks and benefits were discussed with the patient to generally include, but not limited to: infection, bleeding, possible need for post op ERCP, damage to the bile ducts, bile leak, and possible need for further surgery. Alternatives were offered and described. All questions were answered and the patient voiced  understanding of the procedure and wishes to proceed at this point with a laparoscopic cholecystectomy

## 2015-09-22 ENCOUNTER — Inpatient Hospital Stay (HOSPITAL_COMMUNITY)
Admission: RE | Admit: 2015-09-22 | Discharge: 2015-09-22 | Disposition: A | Discharge status: Home / Self Care | Payer: 59 | Source: Ambulatory Visit

## 2015-09-24 ENCOUNTER — Ambulatory Visit (HOSPITAL_COMMUNITY)
Admission: RE | Admit: 2015-09-24 | Payer: BLUE CROSS/BLUE SHIELD | Source: Ambulatory Visit | Admitting: General Surgery

## 2015-09-24 ENCOUNTER — Encounter (HOSPITAL_COMMUNITY): Admission: RE | Payer: Self-pay | Source: Ambulatory Visit

## 2015-09-24 SURGERY — LAPAROSCOPIC CHOLECYSTECTOMY
Anesthesia: General

## 2016-08-08 ENCOUNTER — Emergency Department (HOSPITAL_COMMUNITY): Payer: BLUE CROSS/BLUE SHIELD

## 2016-08-08 ENCOUNTER — Encounter (HOSPITAL_COMMUNITY): Payer: Self-pay | Admitting: Emergency Medicine

## 2016-08-08 ENCOUNTER — Inpatient Hospital Stay (HOSPITAL_COMMUNITY)
Admission: EM | Admit: 2016-08-08 | Discharge: 2016-08-12 | DRG: 419 | Disposition: A | Discharge status: Home / Self Care | Payer: BLUE CROSS/BLUE SHIELD | Attending: General Surgery | Admitting: General Surgery

## 2016-08-08 DIAGNOSIS — Z91040 Latex allergy status: Secondary | ICD-10-CM

## 2016-08-08 DIAGNOSIS — Z79899 Other long term (current) drug therapy: Secondary | ICD-10-CM | POA: Diagnosis not present

## 2016-08-08 DIAGNOSIS — Z6825 Body mass index (BMI) 25.0-25.9, adult: Secondary | ICD-10-CM | POA: Diagnosis not present

## 2016-08-08 DIAGNOSIS — Z87891 Personal history of nicotine dependence: Secondary | ICD-10-CM

## 2016-08-08 DIAGNOSIS — E669 Obesity, unspecified: Secondary | ICD-10-CM | POA: Diagnosis present

## 2016-08-08 DIAGNOSIS — K851 Biliary acute pancreatitis without necrosis or infection: Secondary | ICD-10-CM | POA: Diagnosis present

## 2016-08-08 DIAGNOSIS — K8020 Calculus of gallbladder without cholecystitis without obstruction: Secondary | ICD-10-CM | POA: Diagnosis present

## 2016-08-08 DIAGNOSIS — Z419 Encounter for procedure for purposes other than remedying health state, unspecified: Secondary | ICD-10-CM

## 2016-08-08 DIAGNOSIS — R109 Unspecified abdominal pain: Secondary | ICD-10-CM | POA: Diagnosis present

## 2016-08-08 LAB — CBC
HCT: 41.1 % (ref 36.0–46.0)
Hemoglobin: 14 g/dL (ref 12.0–15.0)
MCH: 31.5 pg (ref 26.0–34.0)
MCHC: 34.1 g/dL (ref 30.0–36.0)
MCV: 92.4 fL (ref 78.0–100.0)
PLATELETS: 279 10*3/uL (ref 150–400)
RBC: 4.45 MIL/uL (ref 3.87–5.11)
RDW: 12.8 % (ref 11.5–15.5)
WBC: 14.6 10*3/uL — AB (ref 4.0–10.5)

## 2016-08-08 LAB — COMPREHENSIVE METABOLIC PANEL
ALT: 907 U/L — AB (ref 14–54)
AST: 1212 U/L — ABNORMAL HIGH (ref 15–41)
Albumin: 4.2 g/dL (ref 3.5–5.0)
Alkaline Phosphatase: 120 U/L (ref 38–126)
Anion gap: 8 (ref 5–15)
BILIRUBIN TOTAL: 1.8 mg/dL — AB (ref 0.3–1.2)
BUN: 21 mg/dL — ABNORMAL HIGH (ref 6–20)
CHLORIDE: 107 mmol/L (ref 101–111)
CO2: 25 mmol/L (ref 22–32)
CREATININE: 0.6 mg/dL (ref 0.44–1.00)
Calcium: 9.1 mg/dL (ref 8.9–10.3)
Glucose, Bld: 159 mg/dL — ABNORMAL HIGH (ref 65–99)
Potassium: 3.7 mmol/L (ref 3.5–5.1)
Sodium: 140 mmol/L (ref 135–145)
TOTAL PROTEIN: 7.6 g/dL (ref 6.5–8.1)

## 2016-08-08 LAB — URINALYSIS, ROUTINE W REFLEX MICROSCOPIC
GLUCOSE, UA: 500 mg/dL — AB
Hgb urine dipstick: NEGATIVE
KETONES UR: NEGATIVE mg/dL
LEUKOCYTES UA: NEGATIVE
NITRITE: NEGATIVE
PROTEIN: NEGATIVE mg/dL
Specific Gravity, Urine: 1.03 (ref 1.005–1.030)
pH: 6 (ref 5.0–8.0)

## 2016-08-08 LAB — LIPASE, BLOOD: LIPASE: 1675 U/L — AB (ref 11–51)

## 2016-08-08 LAB — PREGNANCY, URINE: PREG TEST UR: NEGATIVE

## 2016-08-08 MED ORDER — PIPERACILLIN-TAZOBACTAM 3.375 G IVPB 30 MIN
3.3750 g | Freq: Once | INTRAVENOUS | Status: AC
  Administered 2016-08-08: 3.375 g via INTRAVENOUS
  Filled 2016-08-08: qty 50

## 2016-08-08 MED ORDER — DIPHENHYDRAMINE HCL 25 MG PO CAPS: 25.0000 mg | ORAL_CAPSULE | Freq: Four times a day (QID) | ORAL | Status: DC | PRN

## 2016-08-08 MED ORDER — ENOXAPARIN SODIUM 40 MG/0.4ML ~~LOC~~ SOLN
40.0000 mg | SUBCUTANEOUS | Status: DC
  Administered 2016-08-09 – 2016-08-10 (×2): 40 mg via SUBCUTANEOUS
  Filled 2016-08-08 (×3): qty 0.4

## 2016-08-08 MED ORDER — ONDANSETRON HCL 4 MG/2ML IJ SOLN
4.0000 mg | Freq: Four times a day (QID) | INTRAMUSCULAR | Status: DC | PRN
  Administered 2016-08-08 – 2016-08-11 (×6): 4 mg via INTRAVENOUS
  Filled 2016-08-08 (×6): qty 2

## 2016-08-08 MED ORDER — SODIUM CHLORIDE 0.9 % IV BOLUS (SEPSIS)
1000.0000 mL | Freq: Once | INTRAVENOUS | Status: AC
  Administered 2016-08-08: 1000 mL via INTRAVENOUS

## 2016-08-08 MED ORDER — PIPERACILLIN-TAZOBACTAM 3.375 G IVPB
3.3750 g | Freq: Three times a day (TID) | INTRAVENOUS | Status: DC
  Administered 2016-08-08 – 2016-08-11 (×9): 3.375 g via INTRAVENOUS
  Filled 2016-08-08 (×10): qty 50

## 2016-08-08 MED ORDER — POTASSIUM CHLORIDE IN NACL 20-0.45 MEQ/L-% IV SOLN
INTRAVENOUS | Status: DC
  Administered 2016-08-08 – 2016-08-11 (×8): via INTRAVENOUS
  Filled 2016-08-08 (×10): qty 1000

## 2016-08-08 MED ORDER — DIPHENHYDRAMINE HCL 50 MG/ML IJ SOLN: 25.0000 mg | Freq: Four times a day (QID) | INTRAMUSCULAR | Status: DC | PRN

## 2016-08-08 MED ORDER — ONDANSETRON 4 MG PO TBDP
4.0000 mg | ORAL_TABLET | Freq: Four times a day (QID) | ORAL | Status: DC | PRN
Start: 2016-08-08 — End: 2016-08-12
  Administered 2016-08-12: 4 mg via ORAL
  Filled 2016-08-08: qty 1

## 2016-08-08 MED ORDER — ONDANSETRON HCL 4 MG/2ML IJ SOLN
4.0000 mg | Freq: Once | INTRAMUSCULAR | Status: AC
  Administered 2016-08-08: 4 mg via INTRAVENOUS
  Filled 2016-08-08: qty 2

## 2016-08-08 MED ORDER — HYDROMORPHONE HCL 1 MG/ML IJ SOLN
0.5000 mg | INTRAMUSCULAR | Status: DC | PRN
  Administered 2016-08-08 – 2016-08-11 (×15): 1 mg via INTRAVENOUS
  Filled 2016-08-08 (×16): qty 1

## 2016-08-08 MED ORDER — FAMOTIDINE IN NACL 20-0.9 MG/50ML-% IV SOLN
20.0000 mg | Freq: Two times a day (BID) | INTRAVENOUS | Status: DC
  Administered 2016-08-08 – 2016-08-10 (×6): 20 mg via INTRAVENOUS
  Filled 2016-08-08 (×7): qty 50

## 2016-08-08 MED ORDER — IOPAMIDOL (ISOVUE-300) INJECTION 61%
15.0000 mL | Freq: Once | INTRAVENOUS | Status: AC | PRN
Start: 2016-08-08 — End: 2016-08-08
  Administered 2016-08-08: 15 mL via ORAL

## 2016-08-08 MED ORDER — IOPAMIDOL (ISOVUE-300) INJECTION 61%
100.0000 mL | Freq: Once | INTRAVENOUS | Status: AC | PRN
  Administered 2016-08-08: 100 mL via INTRAVENOUS

## 2016-08-08 MED ORDER — PIPERACILLIN-TAZOBACTAM 3.375 G IVPB: 3.3750 g | Freq: Three times a day (TID) | INTRAVENOUS | Status: DC

## 2016-08-08 MED ORDER — HYDROMORPHONE HCL 1 MG/ML IJ SOLN
1.0000 mg | Freq: Once | INTRAMUSCULAR | Status: AC
  Administered 2016-08-08: 1 mg via INTRAVENOUS
  Filled 2016-08-08: qty 1

## 2016-08-08 MED ORDER — SODIUM CHLORIDE 0.9 % IV BOLUS (SEPSIS): 1000.0000 mL | Freq: Once | INTRAVENOUS | Status: DC

## 2016-08-08 NOTE — Significant Event (Signed)
Patient requested ice chips. She had already had some so she was told that she could have more in a hour.

## 2016-08-08 NOTE — ED Notes (Signed)
Bed: AV40WA11 Expected date:  Expected time:  Means of arrival:  Comments: EMS 45 yo F

## 2016-08-08 NOTE — ED Provider Notes (Signed)
[facility]-EMERGENCY DEPT Provider Note   CSN: [id number] Arrival date & time: 08/08/16  0408     History   Chief Complaint Chief Complaint  Patient presents with  . Abdominal Pain    HPI Tammy Horne is a 45 y.o. female.  The history is provided by the patient.  Abdominal Pain   This is a new problem. The current episode started 12 to 24 hours ago. The problem occurs constantly. The problem has been rapidly worsening. The pain is located in the generalized abdominal region. The pain is severe. Associated symptoms include nausea and vomiting. Pertinent negatives include fever and diarrhea. The symptoms are aggravated by certain positions. Nothing relieves the symptoms. Her past medical history is significant for gallstones.  Patient with known h/o cholelithiasis presents with acute abdominal pain and nausea/vomiting. [place] was [place] from [place] and began having pain and started to vomit She reports it is similar to prior episodes of cholelithiasis She reports she had 4 ETOH drinks over past several days but does not drink ETOH regularly.   She was supposed to have cholecystectomy a year ago but wanted to wait until her daughter is 18 months and walking  Past Medical History:  Diagnosis Date  . Allergy   . AMA (advanced maternal age) primigravida 35+   . Anemia   . Anxiety   . Arthritis   . Asthma   . Pregnancy with abdominal pain of right lower quadrant, antepartum 01/12/2015    Patient Active Problem List   Diagnosis Date Noted  . Vacuum extractor delivery, delivered (4/27) 01/27/2015  . Postpartum care following vaginal delivery 01/27/2015  . Oligohydramnios 01/26/2015  . Pregnancy with abdominal pain of right lower quadrant, antepartum 01/12/2015  . Chronic back pain 10/09/2012  . Neck pain 10/09/2012  . Overweight (BMI 25.0-29.9) 10/09/2012    Past Surgical History:  Procedure Laterality Date  . lipo suction      OB History    Gravida Para Term Preterm AB  Living   3 1 1   2 1    SAB TAB Ectopic Multiple Live Births   2     0 1       Home Medications    Prior to Admission medications   Medication Sig Start Date End Date Taking? Authorizing Provider  ALPRAZolam (XANAX) 0.25 MG tablet Take 0.25 mg by mouth 4 (four) times daily as needed for anxiety.  08/06/16  Yes Historical Provider, MD  dextroamphetamine (DEXTROSTAT) 10 MG tablet Take 20 mg by mouth 3 (three) times daily as needed (focus).  07/10/16  Yes Historical Provider, MD  FLUoxetine (PROZAC) 20 MG capsule Take 60 mg by mouth every morning.   Yes Historical Provider, MD  Ibuprofen-Diphenhydramine HCl (ADVIL PM) 200-25 MG CAPS Take 1 tablet by mouth at bedtime as needed (pain, sleep).   Yes Historical Provider, MD  valACYclovir (VALTREX) 500 MG tablet Take 500 mg by mouth every morning.  07/07/16  Yes Historical Provider, MD    Family History Family History  Problem Relation Age of Onset  . Diabetes Mother   . Hypertension Mother   . Cancer Cousin     breast    Social History Social History  Substance Use Topics  . Smoking status: Former Games developermoker  . Smokeless tobacco: Never Used  . Alcohol use No     Allergies   Latex   Review of Systems Review of Systems  Constitutional: Positive for diaphoresis. Negative for fever.  Gastrointestinal: Positive for abdominal  pain, nausea and vomiting. Negative for diarrhea.  All other systems reviewed and are negative.    Physical Exam Updated Vital Signs BP 119/84 (BP Location: Right Arm)   Pulse 80   Temp 97.4 F (36.3 C) (Oral)   Resp 18   Ht 5\' 7"  (1.702 m)   Wt 83.9 kg   LMP  (LMP Unknown)   SpO2 96%   BMI 28.98 kg/m   Physical Exam CONSTITUTIONAL: Well developed/well nourished, uncomfortable appearing HEAD: Normocephalic/atraumatic EYES: EOMI/PERRL, no icterus ENMT: Mucous membranes dry NECK: supple no meningeal signs SPINE/BACK:entire spine nontender CV: S1/S2 noted, no murmurs/rubs/gallops noted LUNGS: Lungs  are clear to auscultation bilaterally, no apparent distress ABDOMEN: soft, diffuse moderate abdominal tenderness, no rebound or guarding, bowel sounds noted throughout abdomen GU:no cva tenderness NEURO: Pt is awake/alert/appropriate, moves all extremitiesx4.  No facial droop.   EXTREMITIES:  full ROM SKIN: warm, color normal PSYCH: no abnormalities of mood noted, alert and oriented to situation   ED Treatments / Results  Labs (all labs ordered are listed, but only abnormal results are displayed) Labs Reviewed  LIPASE, BLOOD - Abnormal; Notable for the following:       Result Value   Lipase 1,675 (*)    All other components within normal limits  COMPREHENSIVE METABOLIC PANEL - Abnormal; Notable for the following:    Glucose, Bld 159 (*)    BUN 21 (*)    AST 1,212 (*)    ALT 907 (*)    Total Bilirubin 1.8 (*)    All other components within normal limits  CBC - Abnormal; Notable for the following:    WBC 14.6 (*)    All other components within normal limits  URINALYSIS, ROUTINE W REFLEX MICROSCOPIC (NOT AT [facility]) - Abnormal; Notable for the following:    Color, Urine AMBER (*)    APPearance CLOUDY (*)    Glucose, UA 500 (*)    Bilirubin Urine SMALL (*)    All other components within normal limits  PREGNANCY, URINE    EKG  EKG Interpretation None       Radiology No results found.  Procedures Procedures (including critical care time)  Medications Ordered in ED Medications  sodium chloride 0.9 % bolus 1,000 mL (not administered)  HYDROmorphone (DILAUDID) injection 1 mg (1 mg Intravenous Given 08/08/16 0651)  ondansetron (ZOFRAN) injection 4 mg (4 mg Intravenous Given 08/08/16 0650)  piperacillin-tazobactam (ZOSYN) IVPB 3.375 g (3.375 g Intravenous New Bag/Given 08/08/16 0651)  sodium chloride 0.9 % bolus 1,000 mL (1,000 mLs Intravenous New Bag/Given 08/08/16 0650)     Initial Impression / Assessment and Plan / ED Course  I have reviewed the triage vital signs and the  nursing notes.  Pertinent labs & imaging results that were available during my care of the patient were reviewed by me and considered in my medical decision making (see chart for details).  Clinical Course     6:18 AM Pt with significant labs findings/pancreatitis in the setting of known cholelithiasis  pain meds/anti-emetics and antibiotics ordered General surgery consulted 6:30 AM D/w dr Abbey Chatters We discussed  He would like to hold on CT scan for now Give IV fluids And surgical team will evaluate in the next 30-45 minutes 7:29 AM Pt updated on plan, awaiting surgical evaluation  Final Clinical Impressions(s) / ED Diagnoses   Final diagnoses:  Acute biliary pancreatitis, unspecified complication status    New Prescriptions New Prescriptions   No medications on file  Zadie Rhineonald Tamyah Cutbirth, MD 08/08/16 [phone]

## 2016-08-08 NOTE — ED Notes (Signed)
Surgeon at bedside.  

## 2016-08-08 NOTE — H&P (Signed)
[facility] Admission Note  Tammy Horne [birth year]  [id number].    Requesting MD: Christy Gentles, MD Chief Complaint/Reason for Consult: gallstone pancreatitis  HPI:  45 y/o female with a PMH cholelithiasis who presented to [facility] with abdominal pain, nausea, and vomiting. The pain started yesterday evening on an airplane when she was returning from a trip. It is described as upper abdominal pain that is burning and constant, without radiation. She has experienced pain like this in the past secondary to gallstones in the past, but not this severe. Patient states that she was diagnosed with gallstones and was supposed to have a cholecystectomy about a year ago by Dr. Rosendo Gros, but she wanted to wait until her daughter was 18 months old/walking prior to having an operation. Denies tobacco use. Drinks alcohol minimally, as she is still breast feeding. Allergic to latex. Denies use of blood thinning medications. Denies HA, CP, SOB, or urinary symptoms.  ED workup: Lipase 1,675 AST 1,212 ALT 907 t.bili 1.8   ROS: All systems reviewed and otherwise negative except for as above  Family History  Problem Relation Age of Onset  . Diabetes Mother   . Hypertension Mother   . Cancer Cousin     breast    Past Medical History:  Diagnosis Date  . Allergy   . AMA (advanced maternal age) primigravida 43+   . Anemia   . Anxiety   . Arthritis   . Asthma   . Pregnancy with abdominal pain of right lower quadrant, antepartum 01/12/2015    Past Surgical History:  Procedure Laterality Date  . lipo suction      Social History:  reports that she has quit smoking. She has never used smokeless tobacco. She reports that she does not drink alcohol or use drugs.  Allergies:  Allergies  Allergen Reactions  . Latex Rash    Includes bandaids     (Not in a hospital admission)  Blood pressure 125/76, pulse 74, temperature 97.8 F (36.6 C), temperature source Oral, resp. rate 18, height _0   (1.702 m), weight 185 lb (83.9 kg), SpO2 96 %, currently breastfeeding. Physical Exam: General: pleasant, obese white female, somnolent secondary to pain medication HEENT: head is normocephalic, atraumatic. Heart: regular, rate, and rhythm.  No obvious murmurs, gallops, or rubs noted.  Palpable pedal pulses bilaterally Lungs: CTAB, no wheezes, rhonchi, or rales noted.  Respiratory effort nonlabored Abd: soft, TTP RUQ/epigastrium with guarding, ND, +BS, no peritonitis or rebound tenderness MS: all 4 extremities are symmetrical with no cyanosis, clubbing, or edema. Skin: warm and dry with no masses, lesions, or rashes Psych: A&Ox3 with an appropriate affect. Neuro: , normal speech  Results for orders placed or performed during the hospital encounter of 08/08/16 (from the past 48 hour(s))  Lipase, blood     Status: Abnormal   Collection Time: 08/08/16  4:42 AM  Result Value Ref Range   Lipase 1,675 (H) 11 - 51 U/L    Comment: RESULTS CONFIRMED BY MANUAL DILUTION  Comprehensive metabolic panel     Status: Abnormal   Collection Time: 08/08/16  4:42 AM  Result Value Ref Range   Sodium 140 135 - 145 mmol/L   Potassium 3.7 3.5 - 5.1 mmol/L   Chloride 107 101 - 111 mmol/L   CO2 25 22 - 32 mmol/L   Glucose, Bld 159 (H) 65 - 99 mg/dL   BUN 21 (H) 6 - 20 mg/dL   Creatinine, Ser 0.60 0.44 - 1.00 mg/dL  Calcium 9.1 8.9 - 10.3 mg/dL   Total Protein 7.6 6.5 - 8.1 g/dL   Albumin 4.2 3.5 - 5.0 g/dL   AST 1,212 (H) 15 - 41 U/L   ALT 907 (H) 14 - 54 U/L   Alkaline Phosphatase 120 38 - 126 U/L   Total Bilirubin 1.8 (H) 0.3 - 1.2 mg/dL   GFR calc non Af Amer >60 >60 mL/min   GFR calc Af Amer >60 >60 mL/min    Comment: (NOTE) The eGFR has been calculated using the CKD EPI equation. This calculation has not been validated in all clinical situations. eGFR's persistently <60 mL/min signify possible Chronic Kidney Disease.    Anion gap 8 5 - 15  CBC     Status: Abnormal   Collection Time: 08/08/16   4:42 AM  Result Value Ref Range   WBC 14.6 (H) 4.0 - 10.5 K/uL   RBC 4.45 3.87 - 5.11 MIL/uL   Hemoglobin 14.0 12.0 - 15.0 g/dL   HCT 41.1 36.0 - 46.0 %   MCV 92.4 78.0 - 100.0 fL   MCH 31.5 26.0 - 34.0 pg   MCHC 34.1 30.0 - 36.0 g/dL   RDW 12.8 11.5 - 15.5 %   Platelets 279 150 - 400 K/uL  Urinalysis, Routine w reflex microscopic     Status: Abnormal   Collection Time: 08/08/16  4:42 AM  Result Value Ref Range   Color, Urine AMBER (A) YELLOW    Comment: BIOCHEMICALS MAY BE AFFECTED BY COLOR   APPearance CLOUDY (A) CLEAR   Specific Gravity, Urine 1.030 1.005 - 1.030   pH 6.0 5.0 - 8.0   Glucose, UA 500 (A) NEGATIVE mg/dL   Hgb urine dipstick NEGATIVE NEGATIVE   Bilirubin Urine SMALL (A) NEGATIVE   Ketones, ur NEGATIVE NEGATIVE mg/dL   Protein, ur NEGATIVE NEGATIVE mg/dL   Nitrite NEGATIVE NEGATIVE   Leukocytes, UA NEGATIVE NEGATIVE    Comment: MICROSCOPIC NOT DONE ON URINES WITH NEGATIVE PROTEIN, BLOOD, LEUKOCYTES, NITRITE, OR GLUCOSE <1000 mg/dL.  Pregnancy, urine     Status: None   Collection Time: 08/08/16  4:42 AM  Result Value Ref Range   Preg Test, Ur NEGATIVE NEGATIVE    Comment:        THE SENSITIVITY OF THIS METHODOLOGY IS >20 mIU/mL.    No results found.  Assessment/Plan Acute gallstone pancreatitis   Admit to [facility] service  CT abd/pelvis w/ contrast   IVF, bowel rest, analgesics, antiemetics  IV zosyn  Repeat labs in AM   Jill Alexanders, [facility] 08/08/2016, 7:45 AM Pager: [phone] Consults: [phone] Mon-Fri 7:00 am-4:30 pm Sat-Sun 7:00 am-11:30 am

## 2016-08-08 NOTE — ED Triage Notes (Signed)
Per EMS pt presents with lower abd pain with radiation into back that started at 1700 on 08/07/16. Pt was given 4mg  Zofran IVP and 50mcg Fentayl IVP. Pt reported nausea and vomiting on arrival of EMS.

## 2016-08-09 LAB — CBC
HCT: 38.3 % (ref 36.0–46.0)
Hemoglobin: 12.7 g/dL (ref 12.0–15.0)
MCH: 31.1 pg (ref 26.0–34.0)
MCHC: 33.2 g/dL (ref 30.0–36.0)
MCV: 93.6 fL (ref 78.0–100.0)
PLATELETS: 226 10*3/uL (ref 150–400)
RBC: 4.09 MIL/uL (ref 3.87–5.11)
RDW: 12.9 % (ref 11.5–15.5)
WBC: 12 10*3/uL — ABNORMAL HIGH (ref 4.0–10.5)

## 2016-08-09 LAB — COMPREHENSIVE METABOLIC PANEL
ALK PHOS: 148 U/L — AB (ref 38–126)
ALT: 967 U/L — AB (ref 14–54)
AST: 452 U/L — AB (ref 15–41)
Albumin: 3.6 g/dL (ref 3.5–5.0)
Anion gap: 7 (ref 5–15)
BUN: 13 mg/dL (ref 6–20)
CALCIUM: 8.3 mg/dL — AB (ref 8.9–10.3)
CO2: 23 mmol/L (ref 22–32)
CREATININE: 0.49 mg/dL (ref 0.44–1.00)
Chloride: 106 mmol/L (ref 101–111)
GFR calc non Af Amer: >60 mL/min (ref >60)
GLUCOSE: 83 mg/dL (ref 65–99)
Potassium: 3.9 mmol/L (ref 3.5–5.1)
SODIUM: 136 mmol/L (ref 135–145)
Total Bilirubin: 1.6 mg/dL — ABNORMAL HIGH (ref 0.3–1.2)
Total Protein: 6.4 g/dL — ABNORMAL LOW (ref 6.5–8.1)

## 2016-08-09 LAB — LIPASE, BLOOD: LIPASE: 302 U/L — AB (ref 11–51)

## 2016-08-09 NOTE — Progress Notes (Signed)
[facility] Progress Note     Subjective: Abdominal pain improving but still present. Radiates to her right upper back. Nausea associated with pan medications. Denies vomiting. Denies BM. Eager to get home to her children.   Lipase trending down, LFT's stable.  Objective: Vital signs in last 24 hours: Temp:  [97.5 F (36.4 C)-98.7 F (37.1 C)] 98.7 F (37.1 C) (11/08 0456) Pulse Rate:  [63-86] 86 (11/08 0456) Resp:  [18-22] 18 (11/08 0456) BP: (123-136)/(71-86) 129/86 (11/08 0456) SpO2:  [96 %-100 %] 96 % (11/08 0456) Last BM Date: 08/07/16  Intake/Output from previous day: 11/07 0701 - 11/08 0700 In: 3387.5 [I.V.:3137.5; IV Piggyback:250] Out: -  Intake/Output this shift: No intake/output data recorded.  PE: Gen:  Alert, NAD, pleasant Card:  RRR, no M/G/R Pulm:  CTA, no W/R/R Abd: Soft, diffusely tender to palpation without rebound tenderness or guarding, +BS  Lab Results:  Hepatic Function Latest Ref Rng & Units 08/09/2016 08/08/2016 07/09/2015  Total Protein 6.5 - 8.1 g/dL 6.4(L) 7.6 7.0  Albumin 3.5 - 5.0 g/dL 3.6 4.2 4.3  AST 15 - 41 U/L 452(H) 1,212(H) 53(H)  ALT 14 - 54 U/L 967(H) 907(H) 246(H)  Alk Phosphatase 38 - 126 U/L 148(H) 120 98  Total Bilirubin 0.3 - 1.2 mg/dL 1.6(H) 1.8(H) 0.7  Bilirubin, Direct <=0.2 mg/dL - - 0.1   Lipase     Component Value Date/Time   LIPASE 302 (H) 08/09/2016 0529    Recent Labs  08/08/16 0442 08/09/16 0529  WBC 14.6* 12.0*  HGB 14.0 12.7  HCT 41.1 38.3  PLT 279 226   Anti-infectives: Anti-infectives    Start     Dose/Rate Route Frequency Ordered Stop   08/08/16 1400  piperacillin-tazobactam (ZOSYN) IVPB 3.375 g     3.375 g 12.5 mL/hr over 240 Minutes Intravenous Every 8 hours 08/08/16 0921     08/08/16 0815  piperacillin-tazobactam (ZOSYN) IVPB 3.375 g  Status:  Discontinued     3.375 g 12.5 mL/hr over 240 Minutes Intravenous Every 8 hours 08/08/16 0811 08/08/16 0921   08/08/16 0615   piperacillin-tazobactam (ZOSYN) IVPB 3.375 g     3.375 g 100 mL/hr over 30 Minutes Intravenous  Once 08/08/16 0609 08/08/16 0569     Assessment/Plan Acute gallstone pancreatitis              IVF, bowel rest, analgesics, antiemetics  WBC 12.0, trending down  Lipase 302             IV zosyn             Repeat labs in AM   Dispo: continue bowel rest, IVF, abx   LOS: 1 day    Jill Alexanders , [facility] 08/09/2016, 9:58 AM Pager: [phone] Consults: [phone] Mon-Fri 7:00 am-4:30 pm Sat-Sun 7:00 am-11:30 am

## 2016-08-10 LAB — CBC
HCT: 39.2 % (ref 36.0–46.0)
Hemoglobin: 12.9 g/dL (ref 12.0–15.0)
MCH: 31.1 pg (ref 26.0–34.0)
MCHC: 32.9 g/dL (ref 30.0–36.0)
MCV: 94.5 fL (ref 78.0–100.0)
PLATELETS: UNDETERMINED 10*3/uL (ref 150–400)
RBC: 4.15 MIL/uL (ref 3.87–5.11)
RDW: 12.6 % (ref 11.5–15.5)
WBC: 12.1 10*3/uL — ABNORMAL HIGH (ref 4.0–10.5)

## 2016-08-10 LAB — COMPREHENSIVE METABOLIC PANEL
ALBUMIN: 3.6 g/dL (ref 3.5–5.0)
ALT: 590 U/L — AB (ref 14–54)
ANION GAP: 11 (ref 5–15)
AST: 137 U/L — ABNORMAL HIGH (ref 15–41)
Alkaline Phosphatase: 164 U/L — ABNORMAL HIGH (ref 38–126)
BUN: 9 mg/dL (ref 6–20)
CHLORIDE: 104 mmol/L (ref 101–111)
CO2: 20 mmol/L — AB (ref 22–32)
CREATININE: 0.6 mg/dL (ref 0.44–1.00)
Calcium: 8.7 mg/dL — ABNORMAL LOW (ref 8.9–10.3)
GFR calc non Af Amer: >60 mL/min (ref >60)
GLUCOSE: 77 mg/dL (ref 65–99)
Potassium: 4.3 mmol/L (ref 3.5–5.1)
SODIUM: 135 mmol/L (ref 135–145)
Total Bilirubin: 1.7 mg/dL — ABNORMAL HIGH (ref 0.3–1.2)
Total Protein: 7 g/dL (ref 6.5–8.1)

## 2016-08-10 LAB — SURGICAL PCR SCREEN
MRSA, PCR: NEGATIVE
STAPHYLOCOCCUS AUREUS: POSITIVE — AB

## 2016-08-10 LAB — LIPASE, BLOOD: Lipase: 59 U/L — ABNORMAL HIGH (ref 11–51)

## 2016-08-10 NOTE — Progress Notes (Signed)
[facility] Progress Note     Subjective: Abdominal pain improving - pain no longer radiates to her back. Denies fever, chills, nausea, or vomiting. Denies BM. Ambulating. Urinating without hesitancy.  Her mother is in the room with her today.   Objective: Vital signs in last 24 hours: Temp:  [97.7 F (36.5 C)-98.4 F (36.9 C)] 97.9 F (36.6 C) (11/09 0455) Pulse Rate:  [76-87] 87 (11/09 0455) Resp:  [18] 18 (11/09 0455) BP: (140-142)/(75-92) 142/89 (11/09 0455) SpO2:  [95 %] 95 % (11/09 0455) Last BM Date: 08/07/16  Intake/Output from previous day: 11/08 0701 - 11/09 0700 In: 1750 [I.V.:1650; IV Piggyback:100] Out: -   PE: Gen:  Alert, NAD, pleasant and cooperative  Card:  RRR Pulm:  CTABL, no W/R/R Abd: Soft, tender to palpation of upper abdomen with guarding, ND, +BS  Lab Results:  Lipase     Component Value Date/Time   LIPASE 59 (H) 08/10/2016 0511   Hepatic Function Latest Ref Rng & Units 08/10/2016 08/09/2016 08/08/2016  Total Protein 6.5 - 8.1 g/dL 7.0 6.4(L) 7.6  Albumin 3.5 - 5.0 g/dL 3.6 3.6 4.2  AST 15 - 41 U/L 137(H) 452(H) 1,212(H)  ALT 14 - 54 U/L 590(H) 967(H) 907(H)  Alk Phosphatase 38 - 126 U/L 164(H) 148(H) 120  Total Bilirubin 0.3 - 1.2 mg/dL 1.7(H) 1.6(H) 1.8(H)  Bilirubin, Direct <=0.2 mg/dL - - -      Recent Labs  08/09/16 0529 08/10/16 0511  WBC 12.0* 12.1*  HGB 12.7 12.9  HCT 38.3 39.2  PLT 226 PLATELET CLUMPS NOTED ON SMEAR, UNABLE TO ESTIMATE   CMP     Component Value Date/Time   NA 135 08/10/2016 0511   K 4.3 08/10/2016 0511   CL 104 08/10/2016 0511   CO2 20 (L) 08/10/2016 0511   GLUCOSE 77 08/10/2016 0511   BUN 9 08/10/2016 0511   CREATININE 0.60 08/10/2016 0511   CREATININE 0.63 07/07/2015 1237   CALCIUM 8.7 (L) 08/10/2016 0511   PROT 7.0 08/10/2016 0511   ALBUMIN 3.6 08/10/2016 0511   AST 137 (H) 08/10/2016 0511   ALT 590 (H) 08/10/2016 0511   ALKPHOS 164 (H) 08/10/2016 0511   BILITOT 1.7 (H)  08/10/2016 0511   GFRNONAA >60 08/10/2016 0511   GFRAA >60 08/10/2016 6010   Anti-infectives: Anti-infectives    Start     Dose/Rate Route Frequency Ordered Stop   08/08/16 1400  piperacillin-tazobactam (ZOSYN) IVPB 3.375 g     3.375 g 12.5 mL/hr over 240 Minutes Intravenous Every 8 hours 08/08/16 0921     08/08/16 0815  piperacillin-tazobactam (ZOSYN) IVPB 3.375 g  Status:  Discontinued     3.375 g 12.5 mL/hr over 240 Minutes Intravenous Every 8 hours 08/08/16 0811 08/08/16 0921   08/08/16 0615  piperacillin-tazobactam (ZOSYN) IVPB 3.375 g     3.375 g 100 mL/hr over 30 Minutes Intravenous  Once 08/08/16 0609 08/08/16 9323     Assessment/Plan Acute gallstone pancreatitis  IVF, bowel rest, analgesics, antiemetics             WBC 12.1             Lipase 59 IV zosyn Repeat labs in AM    Dispo: continue IVF and bowel rest, repeat labs in AM Likely laparoscopic cholecystectomy with IOC tomorrow - consent ordered.   LOS: 2 days    Jill Alexanders , [facility] Surgery 08/10/2016, 9:55 AM Pager: [phone] Consults: [phone] Mon-Fri 7:00 am-4:30 pm Sat-Sun  7:00 am-11:30 am

## 2016-08-11 ENCOUNTER — Inpatient Hospital Stay (HOSPITAL_COMMUNITY): Payer: BLUE CROSS/BLUE SHIELD | Admitting: Certified Registered Nurse Anesthetist

## 2016-08-11 ENCOUNTER — Encounter (HOSPITAL_COMMUNITY): Payer: Self-pay | Admitting: Certified Registered Nurse Anesthetist

## 2016-08-11 ENCOUNTER — Inpatient Hospital Stay (HOSPITAL_COMMUNITY): Payer: BLUE CROSS/BLUE SHIELD

## 2016-08-11 ENCOUNTER — Encounter (HOSPITAL_COMMUNITY): Admission: EM | Disposition: A | Discharge status: Home / Self Care | Payer: Self-pay | Source: Home / Self Care

## 2016-08-11 HISTORY — PX: CHOLECYSTECTOMY: SHX55

## 2016-08-11 LAB — CBC
HEMATOCRIT: 37 % (ref 36.0–46.0)
HEMOGLOBIN: 12.6 g/dL (ref 12.0–15.0)
MCH: 30.9 pg (ref 26.0–34.0)
MCHC: 34.1 g/dL (ref 30.0–36.0)
MCV: 90.7 fL (ref 78.0–100.0)
Platelets: 247 10*3/uL (ref 150–400)
RBC: 4.08 MIL/uL (ref 3.87–5.11)
RDW: 12 % (ref 11.5–15.5)
WBC: 12.2 10*3/uL — ABNORMAL HIGH (ref 4.0–10.5)

## 2016-08-11 LAB — COMPREHENSIVE METABOLIC PANEL
ALBUMIN: 3.6 g/dL (ref 3.5–5.0)
ALT: 386 U/L — AB (ref 14–54)
AST: 48 U/L — AB (ref 15–41)
Alkaline Phosphatase: 151 U/L — ABNORMAL HIGH (ref 38–126)
Anion gap: 10 (ref 5–15)
BILIRUBIN TOTAL: 1.1 mg/dL (ref 0.3–1.2)
BUN: 9 mg/dL (ref 6–20)
CO2: 22 mmol/L (ref 22–32)
CREATININE: 0.57 mg/dL (ref 0.44–1.00)
Calcium: 9 mg/dL (ref 8.9–10.3)
Chloride: 105 mmol/L (ref 101–111)
GFR calc Af Amer: >60 mL/min (ref >60)
GFR calc non Af Amer: >60 mL/min (ref >60)
GLUCOSE: 89 mg/dL (ref 65–99)
POTASSIUM: 4.2 mmol/L (ref 3.5–5.1)
Sodium: 137 mmol/L (ref 135–145)
TOTAL PROTEIN: 7.2 g/dL (ref 6.5–8.1)

## 2016-08-11 LAB — LIPASE, BLOOD: Lipase: 22 U/L (ref 11–51)

## 2016-08-11 SURGERY — LAPAROSCOPIC CHOLECYSTECTOMY WITH INTRAOPERATIVE CHOLANGIOGRAM
Anesthesia: General

## 2016-08-11 MED ORDER — PROMETHAZINE HCL 25 MG/ML IJ SOLN
6.2500 mg | INTRAMUSCULAR | Status: DC | PRN
  Administered 2016-08-11: 6.25 mg via INTRAVENOUS

## 2016-08-11 MED ORDER — MIDAZOLAM HCL 2 MG/2ML IJ SOLN
INTRAMUSCULAR | Status: AC
  Filled 2016-08-11: qty 2

## 2016-08-11 MED ORDER — FENTANYL CITRATE (PF) 100 MCG/2ML IJ SOLN
INTRAMUSCULAR | Status: AC
  Filled 2016-08-11: qty 4

## 2016-08-11 MED ORDER — FENTANYL CITRATE (PF) 100 MCG/2ML IJ SOLN
INTRAMUSCULAR | Status: AC
  Filled 2016-08-11: qty 2

## 2016-08-11 MED ORDER — MIDAZOLAM HCL 2 MG/2ML IJ SOLN: 0.5000 mg | Freq: Once | INTRAMUSCULAR | Status: DC | PRN

## 2016-08-11 MED ORDER — MIDAZOLAM HCL 5 MG/5ML IJ SOLN
INTRAMUSCULAR | Status: DC | PRN
  Administered 2016-08-11: 2 mg via INTRAVENOUS

## 2016-08-11 MED ORDER — HYDROCODONE-ACETAMINOPHEN 5-325 MG PO TABS
1.0000 | ORAL_TABLET | ORAL | Status: DC | PRN
  Administered 2016-08-11 – 2016-08-12 (×4): 2 via ORAL
  Filled 2016-08-11 (×4): qty 2

## 2016-08-11 MED ORDER — SUGAMMADEX SODIUM 200 MG/2ML IV SOLN
INTRAVENOUS | Status: DC | PRN
  Administered 2016-08-11: 200 mg via INTRAVENOUS

## 2016-08-11 MED ORDER — SUCCINYLCHOLINE CHLORIDE 20 MG/ML IJ SOLN
INTRAMUSCULAR | Status: DC | PRN
  Administered 2016-08-11: 80 mg via INTRAVENOUS

## 2016-08-11 MED ORDER — ALPRAZOLAM 0.25 MG PO TABS
0.2500 mg | ORAL_TABLET | Freq: Four times a day (QID) | ORAL | Status: DC | PRN
Start: 2016-08-11 — End: 2016-08-12

## 2016-08-11 MED ORDER — MEPERIDINE HCL 50 MG/ML IJ SOLN: 6.2500 mg | INTRAMUSCULAR | Status: DC | PRN

## 2016-08-11 MED ORDER — BUPIVACAINE HCL (PF) 0.25 % IJ SOLN
INTRAMUSCULAR | Status: DC | PRN
  Administered 2016-08-11: 27 mL

## 2016-08-11 MED ORDER — HYDROMORPHONE HCL 1 MG/ML IJ SOLN
INTRAMUSCULAR | Status: AC
  Filled 2016-08-11: qty 1

## 2016-08-11 MED ORDER — FENTANYL CITRATE (PF) 100 MCG/2ML IJ SOLN
INTRAMUSCULAR | Status: DC | PRN
  Administered 2016-08-11 (×3): 100 ug via INTRAVENOUS

## 2016-08-11 MED ORDER — ROCURONIUM BROMIDE 10 MG/ML (PF) SYRINGE
PREFILLED_SYRINGE | INTRAVENOUS | Status: DC | PRN
  Administered 2016-08-11: 10 mg via INTRAVENOUS
  Administered 2016-08-11: 40 mg via INTRAVENOUS
  Administered 2016-08-11: 10 mg via INTRAVENOUS

## 2016-08-11 MED ORDER — LIDOCAINE 2% (20 MG/ML) 5 ML SYRINGE
INTRAMUSCULAR | Status: DC | PRN
  Administered 2016-08-11: 20 mg via INTRAVENOUS

## 2016-08-11 MED ORDER — FLUOXETINE HCL 20 MG PO CAPS
60.0000 mg | ORAL_CAPSULE | Freq: Every day | ORAL | Status: DC
  Administered 2016-08-11: 60 mg via ORAL
  Filled 2016-08-11: qty 3

## 2016-08-11 MED ORDER — PROMETHAZINE HCL 25 MG/ML IJ SOLN
INTRAMUSCULAR | Status: AC
  Filled 2016-08-11: qty 1

## 2016-08-11 MED ORDER — HYDROMORPHONE HCL 1 MG/ML IJ SOLN
0.2500 mg | INTRAMUSCULAR | Status: DC | PRN
  Administered 2016-08-11: 0.5 mg via INTRAVENOUS

## 2016-08-11 MED ORDER — PROPOFOL 10 MG/ML IV BOLUS
INTRAVENOUS | Status: DC | PRN
  Administered 2016-08-11: 160 mg via INTRAVENOUS

## 2016-08-11 MED ORDER — EPHEDRINE SULFATE-NACL 50-0.9 MG/10ML-% IV SOSY
PREFILLED_SYRINGE | INTRAVENOUS | Status: DC | PRN
  Administered 2016-08-11: 10 mg via INTRAVENOUS
  Administered 2016-08-11: 5 mg via INTRAVENOUS
  Administered 2016-08-11: 10 mg via INTRAVENOUS

## 2016-08-11 MED ORDER — VALACYCLOVIR HCL 500 MG PO TABS
500.0000 mg | ORAL_TABLET | Freq: Every day | ORAL | Status: DC
  Filled 2016-08-11: qty 1

## 2016-08-11 MED ORDER — IOPAMIDOL (ISOVUE-300) INJECTION 61%
INTRAVENOUS | Status: AC
  Filled 2016-08-11: qty 50

## 2016-08-11 MED ORDER — PROPOFOL 10 MG/ML IV BOLUS
INTRAVENOUS | Status: AC
  Filled 2016-08-11: qty 40

## 2016-08-11 MED ORDER — BUPIVACAINE HCL (PF) 0.25 % IJ SOLN
INTRAMUSCULAR | Status: AC
  Filled 2016-08-11: qty 30

## 2016-08-11 MED ORDER — LACTATED RINGERS IV SOLN
INTRAVENOUS | Status: DC | PRN
  Administered 2016-08-11 (×2): via INTRAVENOUS

## 2016-08-11 SURGICAL SUPPLY — 34 items
ADH SKN CLS APL DERMABOND .7 (GAUZE/BANDAGES/DRESSINGS) ×1
APPLIER CLIP 5 13 M/L LIGAMAX5 (MISCELLANEOUS) ×3
APR CLP MED LRG 5 ANG JAW (MISCELLANEOUS) ×1
BAG SPEC RTRVL LRG 6X4 10 (ENDOMECHANICALS) ×1
CABLE HIGH FREQUENCY MONO STRZ (ELECTRODE) ×3 IMPLANT
CATH REDDICK CHOLANGI 4FR 50CM (CATHETERS) ×3 IMPLANT
CHLORAPREP W/TINT 26ML (MISCELLANEOUS) ×3 IMPLANT
CLIP APPLIE 5 13 M/L LIGAMAX5 (MISCELLANEOUS) ×1 IMPLANT
COVER MAYO STAND STRL (DRAPES) ×3 IMPLANT
COVER SURGICAL LIGHT HANDLE (MISCELLANEOUS) ×3 IMPLANT
DECANTER SPIKE VIAL GLASS SM (MISCELLANEOUS) ×3 IMPLANT
DERMABOND ADVANCED (GAUZE/BANDAGES/DRESSINGS) ×2
DERMABOND ADVANCED .7 DNX12 (GAUZE/BANDAGES/DRESSINGS) ×1 IMPLANT
DRAPE C-ARM 42X120 X-RAY (DRAPES) ×3 IMPLANT
ELECT REM PT RETURN 9FT ADLT (ELECTROSURGICAL) ×3
ELECTRODE REM PT RTRN 9FT ADLT (ELECTROSURGICAL) ×1 IMPLANT
GLOVE BIO SURGEON STRL SZ7.5 (GLOVE) ×1 IMPLANT
GLOVE SURG SS PI 7.5 STRL IVOR (GLOVE) ×2 IMPLANT
GOWN STRL REUS W/TWL XL LVL3 (GOWN DISPOSABLE) ×9 IMPLANT
HEMOSTAT SURGICEL 4X8 (HEMOSTASIS) IMPLANT
IRRIG SUCT STRYKERFLOW 2 WTIP (MISCELLANEOUS) ×3
IRRIGATION SUCT STRKRFLW 2 WTP (MISCELLANEOUS) ×1 IMPLANT
IV CATH 14GX2 1/4 (CATHETERS) ×3 IMPLANT
KIT BASIN OR (CUSTOM PROCEDURE TRAY) ×3 IMPLANT
POUCH SPECIMEN RETRIEVAL 10MM (ENDOMECHANICALS) ×3 IMPLANT
SCISSORS LAP 5X35 DISP (ENDOMECHANICALS) ×3 IMPLANT
SLEEVE XCEL OPT CAN 5 100 (ENDOMECHANICALS) ×6 IMPLANT
SUT MNCRL AB 4-0 PS2 18 (SUTURE) ×3 IMPLANT
TOWEL OR 17X26 10 PK STRL BLUE (TOWEL DISPOSABLE) ×3 IMPLANT
TOWEL OR NON WOVEN STRL DISP B (DISPOSABLE) ×3 IMPLANT
TRAY LAPAROSCOPIC (CUSTOM PROCEDURE TRAY) ×3 IMPLANT
TROCAR BLADELESS OPT 5 100 (ENDOMECHANICALS) ×3 IMPLANT
TROCAR XCEL BLUNT TIP 100MML (ENDOMECHANICALS) ×3 IMPLANT
TUBING INSUF HEATED (TUBING) ×3 IMPLANT

## 2016-08-11 NOTE — Anesthesia Postprocedure Evaluation (Signed)
Anesthesia Post Note  Patient: Tammy PraderSarah A Torre  Procedure(s) Performed: Procedure(s) (LRB): LAPAROSCOPIC CHOLECYSTECTOMY WITH INTRAOPERATIVE CHOLANGIOGRAM (N/A)  Patient location during evaluation: PACU Anesthesia Type: General Level of consciousness: awake and alert, oriented and patient cooperative Pain management: pain level controlled Vital Signs Assessment: post-procedure vital signs reviewed and stable Respiratory status: spontaneous breathing, nonlabored ventilation and respiratory function stable Cardiovascular status: blood pressure returned to baseline and stable Postop Assessment: no signs of nausea or vomiting Anesthetic complications: no    Last Vitals:  Vitals:   08/11/16 1400 08/11/16 1526  BP: (!) 151/80 (!) 153/78  Pulse: 74 80  Resp: 16 18  Temp: 36.6 C     Last Pain:  Vitals:   08/11/16 1531  TempSrc:   PainSc: 7                  Jamila Slatten,E. Kassady Laboy

## 2016-08-11 NOTE — Progress Notes (Addendum)
MD paged. Loss of IV access. IV nurse attempted x3. Tammy Horne responded to give PO pain meds & contact a CRNA to attempt stick. Per OR, no one available to attempt stick at this time. Patient has been medicated for pain. Will continue to attempt to gain access.

## 2016-08-11 NOTE — Anesthesia Preprocedure Evaluation (Signed)
Anesthesia Evaluation  Patient identified by MRN, date of birth, ID band Patient awake    Reviewed: Allergy & Precautions, NPO status , Patient's Chart, lab work & pertinent test results  History of Anesthesia Complications Negative for: history of anesthetic complications  Airway Mallampati: II  TM Distance: >3 FB Neck ROM: Full    Dental  (+) Dental Advisory Given   Pulmonary former smoker,    breath sounds clear to auscultation       Cardiovascular negative cardio ROS   Rhythm:Regular Rate:Normal     Neuro/Psych Anxiety Depression negative neurological ROS     GI/Hepatic Pancreatitis, elevated LFTs N/v with acute chole   Endo/Other  negative endocrine ROS  Renal/GU negative Renal ROS     Musculoskeletal   Abdominal   Peds  Hematology negative hematology ROS (+)   Anesthesia Other Findings   Reproductive/Obstetrics                             Anesthesia Physical Anesthesia Plan  ASA: II  Anesthesia Plan: General   Post-op Pain Management:    Induction: Intravenous and Rapid sequence  Airway Management Planned: Oral ETT  Additional Equipment:   Intra-op Plan:   Post-operative Plan: Extubation in OR  Informed Consent: I have reviewed the patients History and Physical, chart, labs and discussed the procedure including the risks, benefits and alternatives for the proposed anesthesia with the patient or authorized representative who has indicated his/her understanding and acceptance.   Dental advisory given  Plan Discussed with: CRNA and Surgeon  Anesthesia Plan Comments: (Plan routine monitors, GETA)        Anesthesia Quick Evaluation

## 2016-08-11 NOTE — Anesthesia Procedure Notes (Signed)
Procedure Name: Intubation Performed by: Edit Ricciardelli J Pre-anesthesia Checklist: Patient identified, Emergency Drugs available, Suction available, Patient being monitored and Timeout performed Patient Re-evaluated:Patient Re-evaluated prior to inductionOxygen Delivery Method: Circle system utilized Preoxygenation: Pre-oxygenation with 100% oxygen Intubation Type: IV induction Ventilation: Mask ventilation without difficulty Laryngoscope Size: Mac and 3 Grade View: Grade I Tube type: Oral Tube size: 7.0 mm Number of attempts: 1 Airway Equipment and Method: Stylet Placement Confirmation: ETT inserted through vocal cords under direct vision,  positive ETCO2,  CO2 detector and breath sounds checked- equal and bilateral Secured at: 21 cm Tube secured with: Tape Dental Injury: Teeth and Oropharynx as per pre-operative assessment        

## 2016-08-11 NOTE — Op Note (Signed)
08/08/2016 - 08/11/2016  11:14 AM  PATIENT:  Tammy Horne  45 y.o. female  PRE-OPERATIVE DIAGNOSIS:  gallstone pancreatitis  POST-OPERATIVE DIAGNOSIS:  gallstone pancreatitis  PROCEDURE:  Procedure(s): LAPAROSCOPIC CHOLECYSTECTOMY WITH INTRAOPERATIVE CHOLANGIOGRAM (N/A)  SURGEON:  Surgeon(s) and Role:    * Griselda MinerPaul Toth III, MD - Primary  PHYSICIAN ASSISTANT:   ASSISTANTS: none   ANESTHESIA:   general  EBL:  Total I/O In: 1000 [I.V.:1000] Out: 10 [Blood:10]  BLOOD ADMINISTERED:none  DRAINS: none   LOCAL MEDICATIONS USED:  MARCAINE     SPECIMEN:  Source of Specimen:  gallbladder  DISPOSITION OF SPECIMEN:  PATHOLOGY  COUNTS:  YES  TOURNIQUET:  * No tourniquets in log *  DICTATION: .Dragon Dictation   Procedure: After informed consent was obtained the patient was brought to the operating room and placed in the supine position on the operating room table. After adequate induction of general anesthesia the patient's abdomen was prepped with ChloraPrep allowed to dry and draped in usual sterile manner. An appropriate timeout was performed. The area below the umbilicus was infiltrated with quarter percent  Marcaine. A small incision was made with a 15 blade knife. The incision was carried down through the subcutaneous tissue bluntly with a hemostat and [employer] retractors. The linea alba was identified. The linea alba was incised with a 15 blade knife and each side was grasped with Coker clamps. The preperitoneal space was then probed with a hemostat until the peritoneum was opened and access was gained to the abdominal cavity. A 0 Vicryl pursestring stitch was placed in the fascia surrounding the opening. A Hassan cannula was then placed through the opening and anchored in place with the previously placed Vicryl purse string stitch. The abdomen was insufflated with carbon dioxide without difficulty. A laparoscope was inserted through the [facility] in the right upper quadrant was  inspected. Next the epigastric region was infiltrated with % Marcaine. A small incision was made with a 15 blade knife. A 5 mm port was placed bluntly through this incision into the abdominal cavity under direct vision. Next 2 sites were chosen laterally on the right side of the abdomen for placement of 5 mm ports. Each of these areas was infiltrated with quarter percent Marcaine. Small stab incisions were made with a 15 blade knife. 5 mm ports were then placed bluntly through these incisions into the abdominal cavity under direct vision without difficulty. A blunt grasper was placed through the lateralmost 5 mm port and used to grasp the dome of the gallbladder and elevated anteriorly and superiorly. Another blunt grasper was placed through the other 5 mm port and used to retract the body and neck of the gallbladder. A dissector was placed through the epigastric port and using the electrocautery the peritoneal reflection at the gallbladder neck was opened. Blunt dissection was then carried out in this area until the gallbladder neck-cystic duct junction was readily identified and a good window was created. A single clip was placed on the gallbladder neck. A small  ductotomy was made just below the clip with laparoscopic scissors. A 14-gauge Angiocath was then placed through the anterior abdominal wall under direct vision. A Reddick cholangiogram catheter was then placed through the Angiocath and flushed. The catheter was then placed in the cystic duct and anchored in place with a clip. A cholangiogram was obtained that showed no filling defects good emptying into the duodenum an adequate length on the cystic duct. The anchoring clip and catheters were then removed  from the patient. 3 clips were placed proximally on the cystic duct and the duct was divided between the 2 sets of clips. Posterior to this the cystic artery was identified and again dissected bluntly in a circumferential manner until a good window  was  created. 2 clips were placed proximally and one distally on the artery and the artery was divided between the 2 sets of clips. Next a laparoscopic hook cautery device was used to separate the gallbladder from the liver bed. Prior to completely detaching the gallbladder from the liver bed the liver bed was inspected and several small bleeding points were coagulated with the electrocautery until the area was completely hemostatic. The gallbladder was then detached the rest of it from the liver bed without difficulty. A laparoscopic bag was inserted through the hassan port. The laparoscope was moved to the epigastric port. The gallbladder was placed within the bag and the bag was sealed.  The bag with the gallbladder was then removed with the [facility] through the infraumbilical port without difficulty. The fascial defect was then closed with the previously placed Vicryl pursestring stitch as well as with another figure-of-eight 0 Vicryl stitch. The liver bed was inspected again and found to be hemostatic. The abdomen was irrigated with copious amounts of saline until the effluent was clear. The ports were then removed under direct vision without difficulty and were found to be hemostatic. The gas was allowed to escape. The skin incisions were all closed with interrupted 4-0 Monocryl subcuticular stitches. Dermabond dressings were applied. The patient tolerated the procedure well. At the end of the case all needle sponge and instrument counts were correct. The patient was then awakened and taken to recovery in stable condition  PLAN OF CARE: Admit to inpatient   PATIENT DISPOSITION:  PACU - hemodynamically stable.   Delay start of Pharmacological VTE agent (>24hrs) due to surgical blood loss or risk of bleeding: no

## 2016-08-11 NOTE — Progress Notes (Signed)
Patient awake, alert and transferred to surgery. CHG bath completed. OR RN called to make aware that pt was positive for Staph.

## 2016-08-11 NOTE — Transfer of Care (Signed)
Immediate Anesthesia Transfer of Care Note  Patient: Tammy PraderSarah A Baptista  Procedure(s) Performed: Procedure(s): LAPAROSCOPIC CHOLECYSTECTOMY WITH INTRAOPERATIVE CHOLANGIOGRAM (N/A)  Patient Location: PACU  Anesthesia Type:General  Level of Consciousness: awake, alert  and oriented  Airway & Oxygen Therapy: Patient Spontanous Breathing and Patient connected to face mask oxygen  Post-op Assessment: Report given to RN  Post vital signs: Reviewed and stable  Last Vitals: 136/88, 93, 18, 100%  Vitals:   08/10/16 2047 08/11/16 0504  BP: 135/74 125/75  Pulse: 69 89  Resp: 18 18  Temp: 36.7 C 36.6 C    Last Pain:  Vitals:   08/11/16 0936  TempSrc:   PainSc: 2       Patients Stated Pain Goal: 4 (08/11/16 0936)  Complications: No apparent anesthesia complications

## 2016-08-12 MED ORDER — HYDROCODONE-ACETAMINOPHEN 5-325 MG PO TABS: 1.0000 | ORAL_TABLET | ORAL | 0 refills | Status: AC | PRN

## 2016-08-12 NOTE — Progress Notes (Signed)
1 Day Post-Op  Subjective: Feels better. Still sore in abdomen. Tolerated breakfast  Objective: Vital signs in last 24 hours: Temp:  [97.2 F (36.2 C)-98.5 F (36.9 C)] 97.9 F (36.6 C) (11/11 0451) Pulse Rate:  [63-103] 64 (11/11 0451) Resp:  [15-20] 18 (11/11 0451) BP: (128-154)/(70-90) 136/73 (11/11 0451) SpO2:  [93 %-100 %] 95 % (11/11 0451) Last BM Date: 08/07/16  Intake/Output from previous day: 11/10 0701 - 11/11 0700 In: 1820 [P.O.:320; I.V.:1500] Out: 10 [Blood:10] Intake/Output this shift: No intake/output data recorded.  Resp: clear to auscultation bilaterally Cardio: regular rate and rhythm GI: soft, appropriately tender. incisions look good  Lab Results:   Recent Labs  08/10/16 0511 08/11/16 0455  WBC 12.1* 12.2*  HGB 12.9 12.6  HCT 39.2 37.0  PLT PLATELET CLUMPS NOTED ON SMEAR, UNABLE TO ESTIMATE 247   BMET  Recent Labs  08/10/16 0511 08/11/16 0455  NA 135 137  K 4.3 4.2  CL 104 105  CO2 20* 22  GLUCOSE 77 89  BUN 9 9  CREATININE 0.60 0.57  CALCIUM 8.7* 9.0   PT/INR No results for input(s): LABPROT, INR in the last 72 hours. ABG No results for input(s): PHART, HCO3 in the last 72 hours.  Invalid input(s): PCO2, PO2  Studies/Results: Dg Cholangiogram Operative  Result Date: 08/11/2016 CLINICAL DATA:  45 year old female with a history of cholelithiasis EXAM: INTRAOPERATIVE CHOLANGIOGRAM TECHNIQUE: Cholangiographic images from the C-arm fluoroscopic device were submitted for interpretation post-operatively. Please see the procedural report for the amount of contrast and the fluoroscopy time utilized. COMPARISON:  CT 08/08/2016 FINDINGS: Surgical instruments project over the upper abdomen. There is cannulation of the cystic duct/gallbladder neck, with antegrade infusion of contrast. Caliber of the extrahepatic ductal system within normal limits. No large filling defect identified. Free flow of contrast across the ampulla. IMPRESSION:  Intraoperative cholangiogram demonstrates extrahepatic biliary ducts of unremarkable caliber, with no large filling defect identified. Free flow of contrast across the ampulla. Please refer to the dictated operative report for full details of intraoperative findings and procedure Signed, Yvone NeuJaime S. Loreta AveWagner, DO [facility] Electronically Signed   By: Gilmer MorJaime  Wagner D.O.   On: 08/11/2016 11:52    Anti-infectives: Anti-infectives    Start     Dose/Rate Route Frequency Ordered Stop   08/12/16 1000  valACYclovir (VALTREX) tablet 500 mg     500 mg Oral Daily 08/11/16 1304     08/08/16 1400  piperacillin-tazobactam (ZOSYN) IVPB 3.375 g     3.375 g 12.5 mL/hr over 240 Minutes Intravenous Every 8 hours 08/08/16 0921     08/08/16 0815  piperacillin-tazobactam (ZOSYN) IVPB 3.375 g  Status:  Discontinued     3.375 g 12.5 mL/hr over 240 Minutes Intravenous Every 8 hours 08/08/16 0811 08/08/16 0921   08/08/16 0615  piperacillin-tazobactam (ZOSYN) IVPB 3.375 g     3.375 g 100 mL/hr over 30 Minutes Intravenous  Once 08/08/16 0609 08/08/16 0721      Assessment/Plan: s/p Procedure(s): LAPAROSCOPIC CHOLECYSTECTOMY WITH INTRAOPERATIVE CHOLANGIOGRAM (N/A) Advance diet Discharge  LOS: 4 days    TOTH III,Latandra Loureiro S 08/12/2016

## 2016-08-12 NOTE — Discharge Summary (Signed)
Physician Discharge Summary  Patient ID: Tammy PraderSarah A Gerwig MRN: [id number] DOB/AGE: 44/06/1971 44 y.o.  Admit date: 08/08/2016 Discharge date: 08/12/2016  Admission Diagnoses:  Discharge Diagnoses:  Active Problems:   Pancreatitis, gallstone   Discharged Condition: good  Hospital Course: the patient was admitted with gallstone pancreatitis. She was treated with bowel rest until her enzymes were nearly normal. She then underwent lap chole with neg cholangiogram. On pod 1 she was ready for discharge home  Consults: None  Significant Diagnostic Studies: none  Treatments: surgery: as above  Discharge Exam: Blood pressure 136/73, pulse 64, temperature 97.9 F (36.6 C), temperature source Oral, resp. rate 18, height 5\' 7"  (1.702 m), weight 83.9 kg (185 lb), SpO2 95 %, currently breastfeeding. Resp: clear to auscultation bilaterally Cardio: regular rate and rhythm GI: soft, appropriately tender  Disposition: 01-Home or Self Care  Discharge Instructions    Call MD for:  difficulty breathing, headache or visual disturbances    Complete by:  As directed    Call MD for:  extreme fatigue    Complete by:  As directed    Call MD for:  hives    Complete by:  As directed    Call MD for:  persistant dizziness or light-headedness    Complete by:  As directed    Call MD for:  persistant nausea and vomiting    Complete by:  As directed    Call MD for:  redness, tenderness, or signs of infection (pain, swelling, redness, odor or green/yellow discharge around incision site)    Complete by:  As directed    Call MD for:  severe uncontrolled pain    Complete by:  As directed    Call MD for:  temperature >100.4    Complete by:  As directed    Diet - low sodium heart healthy    Complete by:  As directed    Discharge instructions    Complete by:  As directed    May shower. No heavy lifting. Low fat diet   Increase activity slowly    Complete by:  As directed    No wound care    Complete by:   As directed        Medication List    TAKE these medications   ADVIL PM 200-25 MG Caps Generic drug:  Ibuprofen-Diphenhydramine HCl Take 1 tablet by mouth at bedtime as needed (pain, sleep).   ALPRAZolam 0.25 MG tablet Commonly known as:  XANAX Take 0.25 mg by mouth 4 (four) times daily as needed for anxiety.   dextroamphetamine 10 MG tablet Commonly known as:  DEXTROSTAT Take 20 mg by mouth 3 (three) times daily as needed (focus).   FLUoxetine 20 MG capsule Commonly known as:  PROZAC Take 60 mg by mouth every morning.   HYDROcodone-acetaminophen 5-325 MG tablet Commonly known as:  NORCO/VICODIN Take 1-2 tablets by mouth every 4 (four) hours as needed for moderate pain.   valACYclovir 500 MG tablet Commonly known as:  VALTREX Take 500 mg by mouth every morning.      Follow-up Information    TOTH III,PAUL S, MD Follow up in 2 week(s).   Specialty:  General Surgery Contact information: [address] [place] [address] [phone]           Signed: Robyne AskewOTH III,PAUL S 08/12/2016, 9:17 AM

## 2016-08-12 NOTE — Progress Notes (Signed)
Pt discharged home. AVS and follow up care reviewed at bedside. No IV access. No further questions. Pt tolerating diet and ambulating independently, stable for discharge. Justin Mendaudle, Tongela Encinas H, RN

## 2016-08-14 ENCOUNTER — Encounter (HOSPITAL_COMMUNITY): Payer: Self-pay | Admitting: General Surgery

## 2018-05-23 IMAGING — RF DG CHOLANGIOGRAM OPERATIVE
1 series · 4 of 4 positions shown · non-contrast
Comparison: CT 08/08/2016

CLINICAL DATA: 45-year-old female with a history of cholelithiasis

EXAM:
INTRAOPERATIVE CHOLANGIOGRAM
TECHNIQUE: Cholangiographic images from the C-arm fluoroscopic device were
submitted for interpretation post-operatively. Please see the
procedural report for the amount of contrast and the fluoroscopy
time utilized.

[Series 1: run · 4 of 55 frames shown]
[frame 9/55]
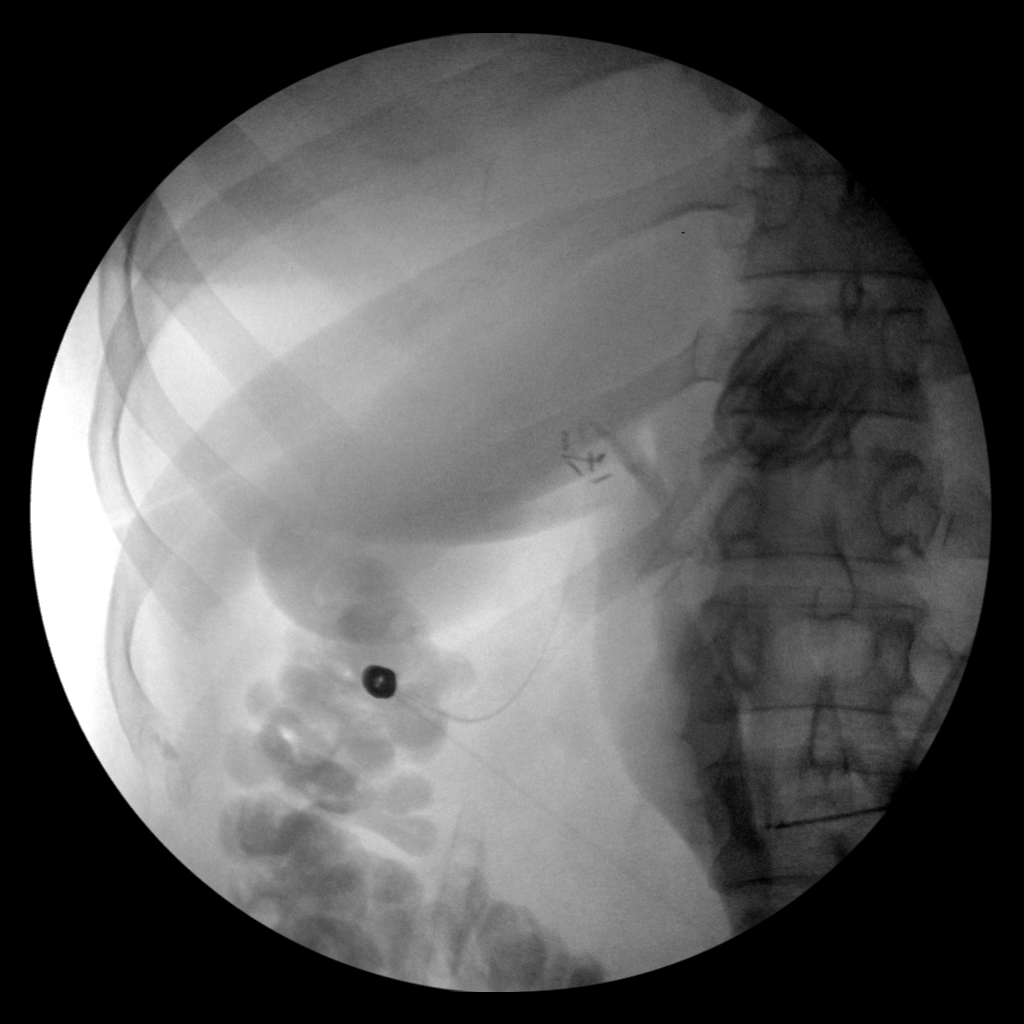
[frame 28/55]
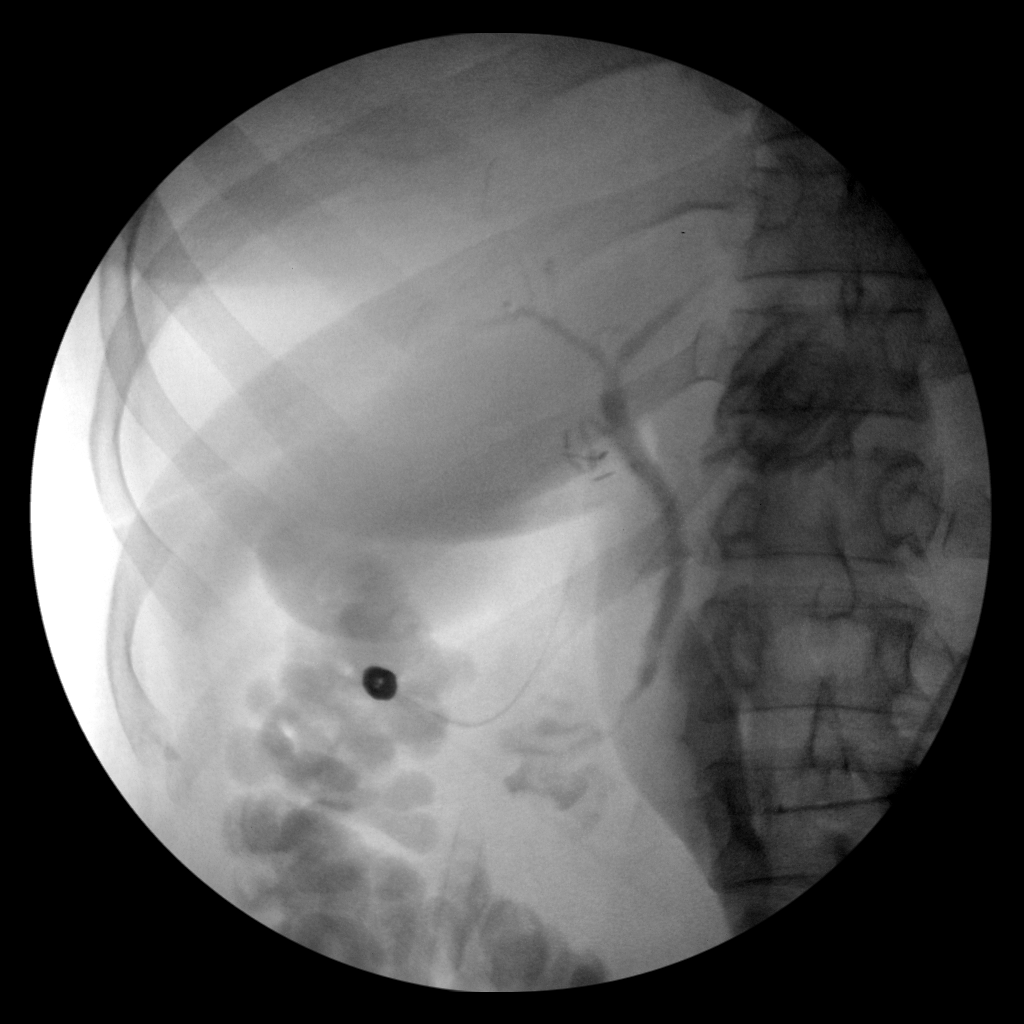
[frame 47/55]
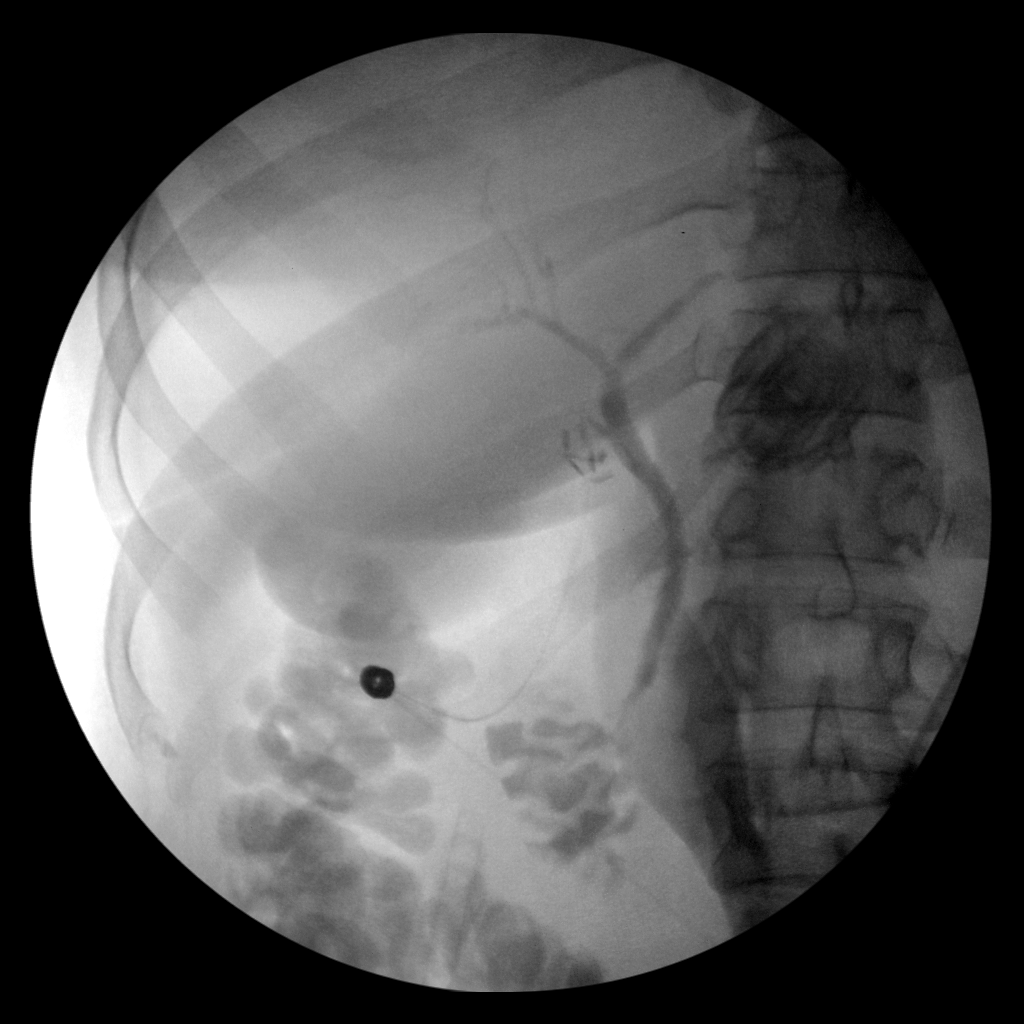
[frame 49/55]
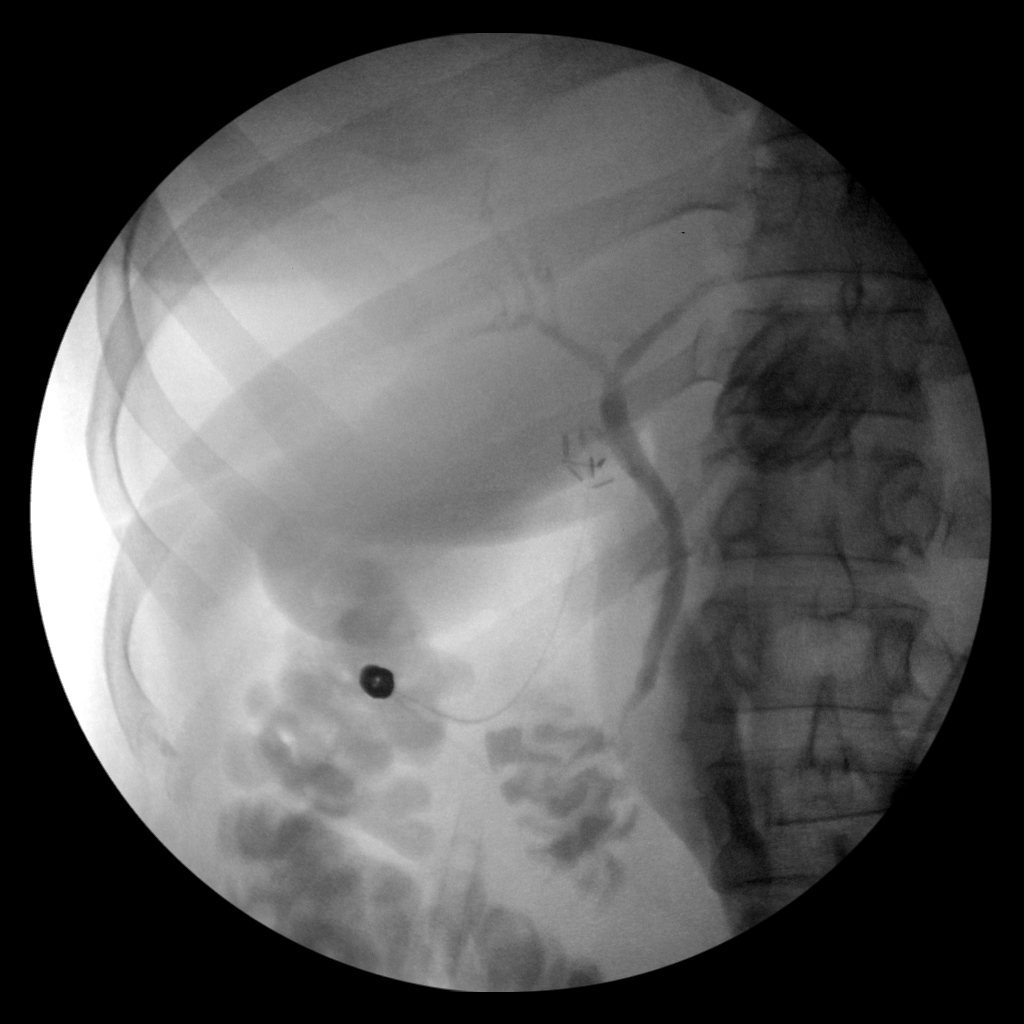

[4 of 4 positions shown; findings below may reference images not displayed]

FINDINGS: Surgical instruments project over the upper abdomen.

There is cannulation of the cystic duct/gallbladder neck, with
antegrade infusion of contrast. Caliber of the extrahepatic ductal
system within normal limits.

No large filling defect identified.

Free flow of contrast across the ampulla.
IMPRESSION: Intraoperative cholangiogram demonstrates extrahepatic biliary ducts
of unremarkable caliber, with no large filling defect identified.
Free flow of contrast across the ampulla.

Please refer to the dictated operative report for full details of
intraoperative findings and procedure

## 2019-06-20 ENCOUNTER — Other Ambulatory Visit: Payer: Self-pay

## 2019-06-20 DIAGNOSIS — Z20822 Contact with and (suspected) exposure to covid-19: Secondary | ICD-10-CM

## 2019-06-21 LAB — NOVEL CORONAVIRUS, NAA: SARS-CoV-2, NAA: NOT DETECTED

## 2019-07-23 ENCOUNTER — Other Ambulatory Visit: Payer: Self-pay

## 2019-07-23 DIAGNOSIS — Z20822 Contact with and (suspected) exposure to covid-19: Secondary | ICD-10-CM

## 2019-07-24 LAB — NOVEL CORONAVIRUS, NAA: SARS-CoV-2, NAA: NOT DETECTED

## 2019-12-06 ENCOUNTER — Ambulatory Visit: Payer: BLUE CROSS/BLUE SHIELD | Attending: Internal Medicine

## 2019-12-06 DIAGNOSIS — Z23 Encounter for immunization: Secondary | ICD-10-CM | POA: Insufficient documentation

## 2019-12-06 NOTE — Progress Notes (Signed)
   Covid-19 Vaccination Clinic  Name:  LEOCADIA IDLEMAN    MRN: [id number] DOB: 1971/09/14  12/06/2019  Ms. Ganci was observed post Covid-19 immunization for 30 minutes based on pre-vaccination screening without incident. She was provided with Vaccine Information Sheet and instruction to access the V-Safe system.   Ms. Daponte was instructed to call 911 with any severe reactions post vaccine: Marland Kitchen Difficulty breathing  . Swelling of face and throat  . A fast heartbeat  . A bad rash all over body  . Dizziness and weakness   Immunizations Administered    Name Date Dose VIS Date Route   Pfizer COVID-19 Vaccine 12/06/2019  5:40 PM 0.3 mL 09/12/2019 Intramuscular   Manufacturer: [employer], [employer]   Lot: [id number]   NDC: [address]-1

## 2020-01-06 ENCOUNTER — Ambulatory Visit: Payer: BLUE CROSS/BLUE SHIELD | Attending: Internal Medicine

## 2020-01-06 DIAGNOSIS — Z23 Encounter for immunization: Secondary | ICD-10-CM

## 2020-01-06 NOTE — Progress Notes (Signed)
   Covid-19 Vaccination Clinic  Name:  Tammy Horne    MRN: [id number] DOB: [birth year]  01/06/2020  [name] was observed post Covid-19 immunization for 30 minutes based on pre-vaccination screening without incident. She was provided with Vaccine Information Sheet and instruction to access the V-Safe system.   [name] was instructed to call 911 with any severe reactions post vaccine: Marland Kitchen Difficulty breathing  . Swelling of face and throat  . A fast heartbeat  . A bad rash all over body  . Dizziness and weakness   Immunizations Administered    Name Date Dose VIS Date Route   Pfizer COVID-19 Vaccine 01/06/2020  9:33 AM 0.3 mL 09/12/2019 Intramuscular   Manufacturer: [employer], [employer]   Lot: [id number]   NDC: [address]-4

## 2022-01-16 ENCOUNTER — Emergency Department (HOSPITAL_COMMUNITY)
Admission: EM | Admit: 2022-01-16 | Discharge: 2022-01-16 | Discharge status: Against Medical Advice | Payer: BLUE CROSS/BLUE SHIELD | Source: Home / Self Care

## 2024-06-20 ENCOUNTER — Emergency Department (HOSPITAL_COMMUNITY)

## 2024-06-20 ENCOUNTER — Emergency Department (HOSPITAL_COMMUNITY)
Admission: EM | Admit: 2024-06-20 | Discharge: 2024-06-20 | Disposition: A | Discharge status: Home / Self Care | Attending: Emergency Medicine | Admitting: Emergency Medicine

## 2024-06-20 DIAGNOSIS — Z91040 Latex allergy status: Secondary | ICD-10-CM | POA: Diagnosis not present

## 2024-06-20 DIAGNOSIS — R55 Syncope and collapse: Secondary | ICD-10-CM | POA: Diagnosis present

## 2024-06-20 LAB — COMPREHENSIVE METABOLIC PANEL WITH GFR
ALT: 86 U/L — ABNORMAL HIGH (ref 0–44)
AST: 27 U/L (ref 15–41)
Albumin: 4.1 g/dL (ref 3.5–5.0)
Alkaline Phosphatase: 60 U/L (ref 38–126)
Anion gap: 11 (ref 5–15)
BUN: 19 mg/dL (ref 6–20)
CO2: 22 mmol/L (ref 22–32)
Calcium: 9.3 mg/dL (ref 8.9–10.3)
Chloride: 106 mmol/L (ref 98–111)
Creatinine, Ser: 0.69 mg/dL (ref 0.44–1.00)
GFR, Estimated: >60 mL/min (ref >60)
Glucose, Bld: 86 mg/dL (ref 70–99)
Potassium: 3.8 mmol/L (ref 3.5–5.1)
Sodium: 138 mmol/L (ref 135–145)
Total Bilirubin: 0.5 mg/dL (ref 0.0–1.2)
Total Protein: 6.8 g/dL (ref 6.5–8.1)

## 2024-06-20 LAB — CBC
HCT: 37.9 % (ref 36.0–46.0)
Hemoglobin: 12.5 g/dL (ref 12.0–15.0)
MCH: 31.4 pg (ref 26.0–34.0)
MCHC: 33 g/dL (ref 30.0–36.0)
MCV: 95.2 fL (ref 80.0–100.0)
Platelets: 245 K/uL (ref 150–400)
RBC: 3.98 MIL/uL (ref 3.87–5.11)
RDW: 12 % (ref 11.5–15.5)
WBC: 8.5 K/uL (ref 4.0–10.5)
nRBC: 0 % (ref 0.0–0.2)

## 2024-06-20 LAB — CBG MONITORING, ED: Glucose-Capillary: 88 mg/dL (ref 70–99)

## 2024-06-20 LAB — HCG, SERUM, QUALITATIVE: Preg, Serum: NEGATIVE

## 2024-06-20 MED ORDER — SODIUM CHLORIDE 0.9 % IV BOLUS
1000.0000 mL | Freq: Once | INTRAVENOUS | Status: AC
  Administered 2024-06-20: 1000 mL via INTRAVENOUS

## 2024-06-20 MED ORDER — IOHEXOL 350 MG/ML SOLN
75.0000 mL | Freq: Once | INTRAVENOUS | Status: AC | PRN
  Administered 2024-06-20: 75 mL via INTRAVENOUS

## 2024-06-20 NOTE — ED Provider Notes (Signed)
 [facility] AT [facility] Provider Note   CSN: [id number] Arrival date & time: 06/20/24  1358     Patient presents with: Knee Pain and Loss of Consciousness   NOELLA KIPNIS is a 53 y.o. female.   53 year old female with prior medical history as detailed open separation.  Patient reports right knee surgery earlier this week.  This morning she was ambulating with crutches to her kitchen.  She became lightheaded while doing this.  She then had a brief syncope in her kitchen.  She did hit her head.  She feels that she may have a tweaked her right knee which is postoperative.  She denies chest pain or shortness of breath.  She reports that she feels better now.  She feels that she may have been taking inadequate p.o. fluids.  The history is provided by the patient and medical records.       Prior to Admission medications   Medication Sig Start Date End Date Taking? Authorizing Provider  ALPRAZolam  (XANAX ) 0.25 MG tablet Take 0.25 mg by mouth 4 (four) times daily as needed for anxiety.  08/06/16   [provider]  dextroamphetamine (DEXTROSTAT) 10 MG tablet Take 20 mg by mouth 3 (three) times daily as needed (focus).  07/10/16   [provider]  FLUoxetine  (PROZAC ) 20 MG capsule Take 60 mg by mouth every morning.    [provider]  HYDROcodone -acetaminophen  (NORCO/VICODIN) 5-325 MG tablet Take 1-2 tablets by mouth every 4 (four) hours as needed for moderate pain. 08/12/16   Curvin Deward MOULD, MD  Ibuprofen -Diphenhydramine  HCl (ADVIL  PM) 200-25 MG CAPS Take 1 tablet by mouth at bedtime as needed (pain, sleep).    [provider]  valACYclovir  (VALTREX ) 500 MG tablet Take 500 mg by mouth every morning.  07/07/16   [provider]    Allergies: Latex    Review of Systems  Cardiovascular:  Positive for syncope.  All other systems reviewed and are negative.   Updated Vital Signs BP 129/81 (BP Location: Left Arm)    Pulse 82   Temp 97.7 F (36.5 C) (Oral)   Resp 18   SpO2 98%   Breastfeeding No   Physical Exam Vitals and nursing note reviewed.  Constitutional:      General: She is not in acute distress.    Appearance: Normal appearance. She is well-developed.  HENT:     Head: Normocephalic and atraumatic.  Eyes:     Conjunctiva/sclera: Conjunctivae normal.     Pupils: Pupils are equal, round, and reactive to light.  Cardiovascular:     Rate and Rhythm: Normal rate and regular rhythm.     Heart sounds: Normal heart sounds.  Pulmonary:     Effort: Pulmonary effort is normal. No respiratory distress.     Breath sounds: Normal breath sounds.  Abdominal:     General: There is no distension.     Palpations: Abdomen is soft.     Tenderness: There is no abdominal tenderness.  Musculoskeletal:        General: No deformity. Normal range of motion.     Cervical back: Normal range of motion and neck supple.  Skin:    General: Skin is warm and dry.  Neurological:     General: No focal deficit present.     Mental Status: She is alert and oriented to person, place, and time.     (all labs ordered are listed, but only abnormal results are displayed) Labs  Reviewed  CBC  COMPREHENSIVE METABOLIC PANEL WITH GFR  URINALYSIS, ROUTINE W REFLEX MICROSCOPIC  HCG, SERUM, QUALITATIVE  CBG MONITORING, ED    EKG: EKG Interpretation Date/Time:  Friday June 20 2024 14:32:09 EDT Ventricular Rate:  81 PR Interval:  110 QRS Duration:  93 QT Interval:  385 QTC Calculation: 447 R Axis:   51  Text Interpretation: Sinus rhythm Borderline short PR interval Baseline wander in lead(s) V1 Confirmed by Laurice Coy [phone]) on 06/20/2024 3:21:42 PM  Radiology: No results found.   Procedures   Medications Ordered in the ED  sodium chloride  0.9 % bolus 1,000 mL (has no administration in time range)                                    Medical Decision Making Patient presents after brief syncopal  event.  She was ambulating with crutches after recent knee surgery.  She experienced a wave of nausea and then had brief syncope.  Described syncope is most likely vasovagal in nature.  Obtained workup is without evidence of significant acute pathology.  Specifically, no evidence of PE, cardiac dysfunction, infection, significant renal dysfunction found.  On reevaluation patient feels improved.  She desires discharge home.  Importance of close follow-up stressed.  Strict return precautions given and understood.  Amount and/or Complexity of Data Reviewed Labs: ordered. Radiology: ordered.  Risk Prescription drug management.        Final diagnoses:  Syncope and collapse    ED Discharge Orders     None          Laurice Coy BROCKS, MD 06/20/24 1735

## 2024-06-20 NOTE — Discharge Instructions (Signed)
 Return for any problem.  ?

## 2024-06-20 NOTE — ED Triage Notes (Signed)
 Patient here from home reporting syncopal episode this evening while walking on crutches to the kitchen. Reports recent left knee surgery. States that she did hit knee on floor.
